# Patient Record
Sex: Female | Born: 1985
Health system: Southern US, Community
[De-identification: ages and names within clinical notes are randomized; demographics above are authoritative.]

## PROBLEM LIST (undated history)

## (undated) ENCOUNTER — Inpatient Hospital Stay (HOSPITAL_COMMUNITY): Payer: Self-pay

## (undated) DIAGNOSIS — F988 Other specified behavioral and emotional disorders with onset usually occurring in childhood and adolescence: Secondary | ICD-10-CM

## (undated) DIAGNOSIS — G43909 Migraine, unspecified, not intractable, without status migrainosus: Secondary | ICD-10-CM

## (undated) DIAGNOSIS — F419 Anxiety disorder, unspecified: Secondary | ICD-10-CM

## (undated) DIAGNOSIS — R51 Headache: Secondary | ICD-10-CM

## (undated) DIAGNOSIS — F32A Depression, unspecified: Secondary | ICD-10-CM

## (undated) DIAGNOSIS — G43019 Migraine without aura, intractable, without status migrainosus: Principal | ICD-10-CM

## (undated) HISTORY — PX: VULVA SURGERY: SHX837

## (undated) HISTORY — DX: Depression, unspecified: F32.A

## (undated) HISTORY — DX: Anxiety disorder, unspecified: F41.9

## (undated) HISTORY — PX: WISDOM TOOTH EXTRACTION: SHX21

## (undated) HISTORY — DX: Migraine without aura, intractable, without status migrainosus: G43.019

## (undated) HISTORY — DX: Other specified behavioral and emotional disorders with onset usually occurring in childhood and adolescence: F98.8

---
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
## 2005-08-13 ENCOUNTER — Emergency Department (HOSPITAL_COMMUNITY): Admission: EM | Admit: 2005-08-13 | Discharge: 2005-08-13 | Payer: Self-pay | Admitting: *Deleted

## 2013-01-11 LAB — OB RESULTS CONSOLE RUBELLA ANTIBODY, IGM: Rubella: IMMUNE

## 2013-01-11 LAB — OB RESULTS CONSOLE GC/CHLAMYDIA
Chlamydia: NEGATIVE
Gonorrhea: NEGATIVE

## 2013-01-11 LAB — OB RESULTS CONSOLE HEPATITIS B SURFACE ANTIGEN: Hepatitis B Surface Ag: NEGATIVE

## 2013-01-13 LAB — OB RESULTS CONSOLE ANTIBODY SCREEN: Antibody Screen: NEGATIVE

## 2013-01-13 LAB — OB RESULTS CONSOLE ABO/RH: RH Type: POSITIVE

## 2013-05-17 LAB — OB RESULTS CONSOLE HIV ANTIBODY (ROUTINE TESTING): HIV: NONREACTIVE

## 2013-05-17 LAB — OB RESULTS CONSOLE RPR: RPR: NONREACTIVE

## 2013-07-07 ENCOUNTER — Encounter (HOSPITAL_COMMUNITY): Payer: Self-pay | Admitting: *Deleted

## 2013-07-07 ENCOUNTER — Inpatient Hospital Stay (HOSPITAL_COMMUNITY)
Admission: AD | Admit: 2013-07-07 | Discharge: 2013-07-07 | Disposition: A | Discharge status: Home / Self Care | Payer: PRIVATE HEALTH INSURANCE | Source: Ambulatory Visit | Attending: Obstetrics and Gynecology | Admitting: Obstetrics and Gynecology

## 2013-07-07 DIAGNOSIS — G43909 Migraine, unspecified, not intractable, without status migrainosus: Secondary | ICD-10-CM | POA: Diagnosis not present

## 2013-07-07 DIAGNOSIS — O368190 Decreased fetal movements, unspecified trimester, not applicable or unspecified: Secondary | ICD-10-CM

## 2013-07-07 DIAGNOSIS — O368131 Decreased fetal movements, third trimester, fetus 1: Secondary | ICD-10-CM

## 2013-07-07 DIAGNOSIS — Q5211 Transverse vaginal septum: Secondary | ICD-10-CM

## 2013-07-07 HISTORY — DX: Headache: R51

## 2013-07-07 MED ORDER — OXYCODONE-ACETAMINOPHEN 5-325 MG PO TABS: 2.0000 | ORAL_TABLET | Freq: Once | ORAL | Status: DC

## 2013-07-07 NOTE — MAU Note (Signed)
Patient states she has not felt fetal movement since Tuesday night until in the waiting room. Denies bleeding, leaking or pain.

## 2013-07-07 NOTE — Discharge Instructions (Signed)
Fetal Monitoring, Fetal Movement Assessment  Fetal movement assessment (FMA) is done by the pregnant woman herself by counting and recording the baby's movements over a certain time period. It is done to see if there are problems with the pregnancy and the baby. Identifying and correcting problems may prevent serious problems from developing with the fetus, including fetal loss. Some pregnancies are complicated by the mother's medical problems. Some of these problems are type 1 diabetes mellitus, high blood pressure and other chronic medical illnesses. This is why it is important to monitor the baby before birth.   OTHER TECHNIQUES OF MONITORING YOUR BABY BEFORE BIRTH  Several tests are in use. These include:   Nonstress test (NST). This test monitors the baby's heart rate when the baby moves.   Contraction stress test (CST). This test monitors the baby's heart rate during a contraction of the uterus.   Fetal biophysical profile (BPP). This measures and evaluates 5 observations of the baby:   The nonstress test.   The baby's breathing.   The baby's movements.   The baby's muscle tone.   The amount of amniotic fluid.   Modified BPP. This measures the volume of fluid in different parts of the amniotic sac (amniotic fluid index) and the results of the nonstress test.   Umbilical artery doppler velocimetry. This evaluates the blood flow through the umbilical cord.  There are several very serious problems that cannot be predicted or detected with any of the fetal monitoring procedures. These problems include separation (abruption) of the placenta or when the fetus chokes on the umbilical cord (umbilical cord accident).  Your caregiver will help you understand your tests and what they mean for you and your baby. It is your responsibility to obtain the results of your test.  LET YOUR CAREGIVER KNOW ABOUT:    Any medications you are taking including prescription and over-the-counter drugs, herbs, eye drops and  creams.   If you have a fever.   If you have an infection.   If you are sick.  RISKS AND COMPLICATIONS   There are no risks or complications to the mother or fetus with FMA.  BEFORE THE PROCEDURE   Do not take medications that may decrease or increase the baby's heart rate and/or movements.   Eat a full meal at least 2 hours before the test.   Do not smoke if you are pregnant. If you smoke, stop at least 2 days before the test. It is best not to smoke at all when you are pregnant.  PROCEDURE  Sometimes, a mother notices her baby moves less before there are problems. Because of this, it is believed that fetal movement checking by the mother (kick counts) is a good way to check the baby before birth.  There are different ways of doing this. Two good ways are:   The woman lies on her side and counts distinct (individual) fetal movements. A feeling of 10 distinct movements in a period of up to 2 hours is considered reassuring. When 10 movements are felt, you may stop counting.   Women are instructed to count fetal movements for 1 hour, three times per week. The count is good if, after one week, it equals or is over the woman's previously established baseline count. If the count is lower, further checking of your baby is needed.  AFTER THE PROCEDURE  You may resume your usual activities.  HOME CARE INSTRUCTIONS    Follow your caregiver's advice and recommendations.     Be aware of your baby's movements. Are they normal, less than usual or more than usual?   Make and keep the rest of your prenatal appointments.  SEEK MEDICAL CARE IF:    You develop a temperature of 100 F (37.8 C) or higher.   You have a bloody mucus discharge from the vagina (a bloody show).  SEEK IMMEDIATE MEDICAL CARE IF:    You do not feel the baby move.   You think the baby's movements are too little or too many.   You develop contractions.   You develop vaginal bleeding.   You have belly (abdominal) pain.   You have leaking or a  gush of fluid from the vagina.  Document Released: 07/18/2002 Document Revised: 10/20/2011 Document Reviewed: 11/20/2008  [employer] Patient Information 2014 [employer].

## 2013-07-07 NOTE — MAU Provider Note (Signed)
  History     CSN: [id number]  Arrival date and time: 07/07/13 1027   First Provider Initiated Contact with Patient 07/07/13 1101      Chief Complaint  Patient presents with  . Decreased Fetal Movement   HPI This is a 27 y.o. female at [redacted]w[redacted]d who presents with report of no fetal movement in 2 days. No leaking or bleeding. No pain  RN Note Patient states she has not felt fetal movement since Tuesday night until in the waiting room. Denies bleeding, leaking or pain.       OB History   Grav Para Term Preterm Abortions TAB SAB Ect Mult Living   1         0      Past Medical History  Diagnosis Date  . Headache(784.0)     Migraines    No past surgical history on file.  No family history on file.  History  Substance Use Topics  . Smoking status: Not on file  . Smokeless tobacco: Not on file  . Alcohol Use: Not on file    Allergies: No Known Allergies  No prescriptions prior to admission    Review of Systems  Constitutional: Negative for fever, chills and malaise/fatigue.  Gastrointestinal: Negative for nausea, vomiting and abdominal pain.  Neurological: Negative for weakness.   Physical Exam   Blood pressure 107/74, pulse 100, temperature 98 F (36.7 C), temperature source Oral, resp. rate 18.  Physical Exam  Constitutional: She is oriented to person, place, and time. She appears well-developed and well-nourished. No distress.  Cardiovascular: Normal rate.   Respiratory: Effort normal.  GI: Soft.  Genitourinary:  Fetal heart rate reactive.  No contractions Audible fetal movement Category I  Musculoskeletal: Normal range of motion.  Neurological: She is alert and oriented to person, place, and time.  Skin: Skin is warm and dry.  Psychiatric: She has a normal mood and affect.    MAU Course  Procedures  MDM Discussed with Dr Ellyn Hack  Assessment and Plan  A:  [facility] at [redacted]w[redacted]d       Decreased perception of fetal movement  P:  Reassured  Discharged home   [facility] 07/07/2013, 11:04 AM

## 2013-07-14 LAB — OB RESULTS CONSOLE GBS: GBS: NEGATIVE

## 2013-08-11 NOTE — L&D Delivery Note (Signed)
Operative Delivery Note Pt pushe well for an hour and a half and brought the vertex to a +3 station..  She became exhausted and requested assistance, and the baby began to have deeper variables with pushing.  She and husband were counseled on the vacuum and risk of cephalohemtoma and desired to proceed.  The bladder was emptied and the Kiwi vacuum applied LOA +3 station in the green zone.  Over about 3 sets of pushes the vertex was brought to crowning, then the vacuum removed and the patient pushed out the remainder of the infant's head, the rest of the body followed without difficulty. At 11:41 PM a viable female was delivered via Vaginal, Vacuum Investment banker, operational(Extractor).  Presentation: vertex; Position: Left,, Occiput,, Anterior; Station: +3.  Verbal consent: obtained from patient.  Risks and benefits discussed in detail.  Risks include, but are not limited to the risks of anesthesia, bleeding, infection, damage to maternal tissues, fetal cephalhematoma.  There is also the risk of inability to effect vaginal delivery of the head, or shoulder dystocia that cannot be resolved by established maneuvers, leading to the need for emergency cesarean section.  APGAR: 7, 8; weigh pending .   Placenta status: Intact, Spontaneous.   Cord: 3 vessels with the following complications none: .   Anesthesia: Epidural Local 1% lidocaine Instruments: Kiwi vacuum Episiotomy: None Lacerations: 2nd degree;Perineal Suture Repair: 2.0 3.0 vicryl  (sphincter reinforced Est. Blood Loss (mL): 350cc  Mom to postpartum.  Baby to Couplet care / Skin to Skin.  Gabrielle Foster,Gabrielle Foster 08/15/2013, 12:26 AM

## 2013-08-14 ENCOUNTER — Inpatient Hospital Stay (HOSPITAL_COMMUNITY)
Admission: AD | Admit: 2013-08-14 | Discharge: 2013-08-16 | DRG: 775 | Disposition: A | Discharge status: Home / Self Care | Payer: PRIVATE HEALTH INSURANCE | Source: Ambulatory Visit | Attending: Obstetrics and Gynecology | Admitting: Obstetrics and Gynecology

## 2013-08-14 ENCOUNTER — Encounter (HOSPITAL_COMMUNITY): Payer: PRIVATE HEALTH INSURANCE | Admitting: Anesthesiology

## 2013-08-14 ENCOUNTER — Encounter (HOSPITAL_COMMUNITY): Payer: Self-pay

## 2013-08-14 ENCOUNTER — Inpatient Hospital Stay (HOSPITAL_COMMUNITY): Payer: PRIVATE HEALTH INSURANCE | Admitting: Anesthesiology

## 2013-08-14 DIAGNOSIS — IMO0001 Reserved for inherently not codable concepts without codable children: Secondary | ICD-10-CM

## 2013-08-14 HISTORY — DX: Migraine, unspecified, not intractable, without status migrainosus: G43.909

## 2013-08-14 LAB — CBC
HCT: 37.2 % (ref 36.0–46.0)
Hemoglobin: 12.2 g/dL (ref 12.0–15.0)
MCH: 28.8 pg (ref 26.0–34.0)
MCHC: 32.8 g/dL (ref 30.0–36.0)
MCV: 87.7 fL (ref 78.0–100.0)
Platelets: 134 10*3/uL — ABNORMAL LOW (ref 150–400)
RBC: 4.24 MIL/uL (ref 3.87–5.11)
RDW: 14.7 % (ref 11.5–15.5)
WBC: 14 10*3/uL — ABNORMAL HIGH (ref 4.0–10.5)

## 2013-08-14 LAB — RPR: RPR Ser Ql: NONREACTIVE

## 2013-08-14 MED ORDER — OXYTOCIN 40 UNITS IN LACTATED RINGERS INFUSION - SIMPLE MED
1.0000 m[IU]/min | INTRAVENOUS | Status: DC
  Administered 2013-08-14: 2 m[IU]/min via INTRAVENOUS
  Filled 2013-08-14: qty 1000

## 2013-08-14 MED ORDER — ACETAMINOPHEN 325 MG PO TABS
650.0000 mg | ORAL_TABLET | ORAL | Status: DC | PRN
  Administered 2013-08-14: 650 mg via ORAL
  Filled 2013-08-14: qty 2

## 2013-08-14 MED ORDER — PHENYLEPHRINE 40 MCG/ML (10ML) SYRINGE FOR IV PUSH (FOR BLOOD PRESSURE SUPPORT)
80.0000 ug | PREFILLED_SYRINGE | INTRAVENOUS | Status: DC | PRN
  Filled 2013-08-14: qty 2

## 2013-08-14 MED ORDER — CITRIC ACID-SODIUM CITRATE 334-500 MG/5ML PO SOLN: 30.0000 mL | ORAL | Status: DC | PRN

## 2013-08-14 MED ORDER — FENTANYL 2.5 MCG/ML BUPIVACAINE 1/10 % EPIDURAL INFUSION (WH - ANES)
INTRAMUSCULAR | Status: AC
  Filled 2013-08-14: qty 125

## 2013-08-14 MED ORDER — FLEET ENEMA 7-19 GM/118ML RE ENEM: 1.0000 | ENEMA | Freq: Once | RECTAL | Status: DC

## 2013-08-14 MED ORDER — LIDOCAINE HCL (PF) 1 % IJ SOLN
INTRAMUSCULAR | Status: DC | PRN
  Administered 2013-08-14 (×2): 8 mL

## 2013-08-14 MED ORDER — DIPHENHYDRAMINE HCL 50 MG/ML IJ SOLN: 12.5000 mg | INTRAMUSCULAR | Status: DC | PRN

## 2013-08-14 MED ORDER — OXYCODONE-ACETAMINOPHEN 5-325 MG PO TABS: 1.0000 | ORAL_TABLET | ORAL | Status: DC | PRN

## 2013-08-14 MED ORDER — SODIUM CHLORIDE 0.9 % IV SOLN
3.0000 g | Freq: Four times a day (QID) | INTRAVENOUS | Status: DC
  Administered 2013-08-14 – 2013-08-15 (×3): 3 g via INTRAVENOUS
  Filled 2013-08-14 (×4): qty 3

## 2013-08-14 MED ORDER — FENTANYL 2.5 MCG/ML BUPIVACAINE 1/10 % EPIDURAL INFUSION (WH - ANES)
INTRAMUSCULAR | Status: DC | PRN
  Administered 2013-08-14: 14 mL/h via EPIDURAL

## 2013-08-14 MED ORDER — OXYTOCIN 40 UNITS IN LACTATED RINGERS INFUSION - SIMPLE MED: 62.5000 mL/h | INTRAVENOUS | Status: DC

## 2013-08-14 MED ORDER — OXYTOCIN BOLUS FROM INFUSION
500.0000 mL | INTRAVENOUS | Status: DC
  Administered 2013-08-14: 500 mL via INTRAVENOUS

## 2013-08-14 MED ORDER — LACTATED RINGERS IV SOLN: 500.0000 mL | Freq: Once | INTRAVENOUS | Status: DC

## 2013-08-14 MED ORDER — PHENYLEPHRINE 40 MCG/ML (10ML) SYRINGE FOR IV PUSH (FOR BLOOD PRESSURE SUPPORT)
PREFILLED_SYRINGE | INTRAVENOUS | Status: AC
  Filled 2013-08-14: qty 5

## 2013-08-14 MED ORDER — LACTATED RINGERS IV SOLN
INTRAVENOUS | Status: DC
  Administered 2013-08-14 (×3): via INTRAVENOUS

## 2013-08-14 MED ORDER — LIDOCAINE HCL (PF) 1 % IJ SOLN
30.0000 mL | INTRAMUSCULAR | Status: DC | PRN
  Administered 2013-08-14: 30 mL via SUBCUTANEOUS
  Filled 2013-08-14 (×2): qty 30

## 2013-08-14 MED ORDER — IBUPROFEN 600 MG PO TABS
600.0000 mg | ORAL_TABLET | Freq: Four times a day (QID) | ORAL | Status: DC | PRN
  Administered 2013-08-15: 600 mg via ORAL
  Filled 2013-08-14: qty 1

## 2013-08-14 MED ORDER — LACTATED RINGERS IV SOLN: 500.0000 mL | INTRAVENOUS | Status: DC | PRN

## 2013-08-14 MED ORDER — EPHEDRINE 5 MG/ML INJ
10.0000 mg | INTRAVENOUS | Status: DC | PRN
  Filled 2013-08-14: qty 2

## 2013-08-14 MED ORDER — FENTANYL 2.5 MCG/ML BUPIVACAINE 1/10 % EPIDURAL INFUSION (WH - ANES)
14.0000 mL/h | INTRAMUSCULAR | Status: DC | PRN
  Administered 2013-08-14: 14 mL/h via EPIDURAL
  Filled 2013-08-14: qty 125

## 2013-08-14 MED ORDER — TERBUTALINE SULFATE 1 MG/ML IJ SOLN: 0.2500 mg | Freq: Once | INTRAMUSCULAR | Status: AC | PRN

## 2013-08-14 MED ORDER — EPHEDRINE 5 MG/ML INJ
INTRAVENOUS | Status: AC
  Filled 2013-08-14: qty 4

## 2013-08-14 MED ORDER — ONDANSETRON HCL 4 MG/2ML IJ SOLN: 4.0000 mg | Freq: Four times a day (QID) | INTRAMUSCULAR | Status: DC | PRN

## 2013-08-14 NOTE — Anesthesia Preprocedure Evaluation (Signed)
Anesthesia Evaluation  Patient identified by MRN, date of birth, ID band Patient awake    Reviewed: Allergy & Precautions, H&P , NPO status , Patient's Chart, lab work & pertinent test results  Airway Mallampati: I TM Distance: >3 FB Neck ROM: full    Dental no notable dental hx.    Pulmonary neg pulmonary ROS,    Pulmonary exam normal       Cardiovascular negative cardio ROS  Rate:Normal     Neuro/Psych negative psych ROS   GI/Hepatic negative GI ROS, Neg liver ROS,   Endo/Other  negative endocrine ROS  Renal/GU negative Renal ROS  negative genitourinary   Musculoskeletal negative musculoskeletal ROS (+)   Abdominal Normal abdominal exam  (+)   Peds  Hematology negative hematology ROS (+)   Anesthesia Other Findings   Reproductive/Obstetrics (+) Pregnancy                           Anesthesia Physical Anesthesia Plan  ASA: II  Anesthesia Plan: Epidural   Post-op Pain Management:    Induction:   Airway Management Planned:   Additional Equipment:   Intra-op Plan:   Post-operative Plan:   Informed Consent: I have reviewed the patients History and Physical, chart, labs and discussed the procedure including the risks, benefits and alternatives for the proposed anesthesia with the patient or authorized representative who has indicated his/her understanding and acceptance.     Plan Discussed with:   Anesthesia Plan Comments:         Anesthesia Quick Evaluation

## 2013-08-14 NOTE — Anesthesia Procedure Notes (Signed)
Epidural Patient location during procedure: OB Start time: 08/14/2013 11:32 AM End time: 08/14/2013 11:36 AM  Staffing Anesthesiologist: Leilani AbleHATCHETT, Jaimey Franchini Performed by: anesthesiologist   Preanesthetic Checklist Completed: patient identified, surgical consent, pre-op evaluation, timeout performed, IV checked, risks and benefits discussed and monitors and equipment checked  Epidural Patient position: sitting Prep: site prepped and draped and DuraPrep Patient monitoring: continuous pulse ox and blood pressure Approach: midline Injection technique: LOR air  Needle:  Needle type: Tuohy  Needle gauge: 17 G Needle length: 9 cm and 9 Needle insertion depth: 6 cm Catheter type: closed end flexible Catheter size: 19 Gauge Catheter at skin depth: 11 cm Test dose: negative and Other  Assessment Sensory level: T9 Events: blood not aspirated, injection not painful, no injection resistance, negative IV test and no paresthesia  Additional Notes Reason for block:procedure for pain

## 2013-08-14 NOTE — MAU Note (Signed)
Contractions every 3-5 min, started getting worse at 4:30, Lost mucus plug, denies water broke.

## 2013-08-14 NOTE — Progress Notes (Signed)
Patient ID: Gabrielle BlackwaterSarah M Foster, female   DOB: [birth year], 28 y.o.   MRN: [id number] Pt has made slow but steady progress and is now completely dilated +2 station Spiked temp to 101+ and started on Unasyn Baby with resultant tachycardia from temp, but otherwise good variability and accels. Will begin pushing.

## 2013-08-14 NOTE — H&P (Signed)
Gabrielle Foster is a 28 y.o. female G1P0 at 5040 2/7 weeks (EDD 08/12/13 by 7 week US) presenting for regular contractions all night long.  Cervix in the office last visit was 50/closed and on admission 80/1-2.  Pt was admitted in early labor for pain management and will be augmented if needed.  Prenatal care uncomplicated.    Maternal Medical History:  Reason for admission: Contractions.   Contractions: Onset was 6-12 hours ago.   Frequency: regular.   Perceived severity is moderate.    Fetal activity: Perceived fetal activity is normal.      OB History   Grav Para Term Preterm Abortions TAB SAB Ect Mult Living   1         0     Past Medical History  Diagnosis Date  . Headache(784.0)     Migraines  . Migraines    Past Surgical History  Procedure Laterality Date  . Wisdom tooth extraction     Family History: family history includes Hyperlipidemia in her mother; Hypertension in her father; Kidney disease in her mother. Social History:  reports that she has never smoked. She has never used smokeless tobacco. She reports that she does not drink alcohol or use illicit drugs.   Prenatal Transfer Tool  Maternal Diabetes: No Genetic Screening: Normal Maternal Ultrasounds/Referrals: Normal Fetal Ultrasounds or other Referrals:  None Maternal Substance Abuse:  No Significant Maternal Medications:  None Significant Maternal Lab Results:  None Other Comments:  None  Review of Systems  Neurological: Negative for headaches.    Dilation: 2 Effacement (%): 90 Station: -2 Exam by:: Gabrielle Oresichardson MD AROM moderate meconium  Blood pressure 111/65, pulse 87, temperature 97.7 F (36.5 C), temperature source Oral, resp. rate 16, height 5\' 8"  (1.727 m), weight 87.091 kg (192 lb), SpO2 98.00%, unknown if currently breastfeeding. Maternal Exam:  Uterine Assessment: Contraction strength is moderate.  Contraction frequency is regular.   Abdomen: Fetal presentation: vertex  Introitus:  Normal vulva. Normal vagina.    Physical Exam  Constitutional: She is oriented to person, place, and time. She appears well-developed and well-nourished.  Cardiovascular: Normal rate and regular rhythm.   Respiratory: Effort normal and breath sounds normal.  GI: Soft.  Genitourinary: Vagina normal.  Neurological: She is alert and oriented to person, place, and time.  Psychiatric: She has a normal mood and affect. Her behavior is normal.    Prenatal labs: ABO, Rh: A/Positive/-- (06/05 0000) Antibody: Negative (06/05 0000) Rubella: Immune (06/03 0000) RPR: Nonreactive (10/07 0000)  HBsAg: Negative (06/03 0000)  HIV: Non-reactive (10/07 0000)  GBS: Negative (12/04 0000)  One hour GCT 127 First trimester screen and AFP WNL  Assessment/Plan: Pt requesting epidural and will receive, Will augment with pitocin as needed.  D/Foster pt meconium stained fluid.   Gabrielle Foster,Gabrielle Foster 08/14/2013, 11:50 AM

## 2013-08-14 NOTE — Progress Notes (Signed)
Dr Senaida Oresichardson notified of pt's VE, contraction pattern, and FHR pattern. Orders received to admit pt.

## 2013-08-14 NOTE — Progress Notes (Signed)
Patient ID: Gabrielle LintSarah S Gonzalez-Graham, female   DOB: [birth year], 28 y.o.   MRN: [id number] Pt comfortable with epidural afeb vss FHR category 1 Cervix 4/90/-1 IUPC placed to adjust pitocin. Follow progress

## 2013-08-15 ENCOUNTER — Encounter (HOSPITAL_COMMUNITY): Payer: Self-pay

## 2013-08-15 LAB — CBC
HCT: 31.7 % — ABNORMAL LOW (ref 36.0–46.0)
Hemoglobin: 10.3 g/dL — ABNORMAL LOW (ref 12.0–15.0)
MCH: 28.8 pg (ref 26.0–34.0)
MCHC: 32.5 g/dL (ref 30.0–36.0)
MCV: 88.5 fL (ref 78.0–100.0)
Platelets: 125 10*3/uL — ABNORMAL LOW (ref 150–400)
RBC: 3.58 MIL/uL — ABNORMAL LOW (ref 3.87–5.11)
RDW: 14.9 % (ref 11.5–15.5)
WBC: 23.6 10*3/uL — ABNORMAL HIGH (ref 4.0–10.5)

## 2013-08-15 MED ORDER — IBUPROFEN 600 MG PO TABS
600.0000 mg | ORAL_TABLET | Freq: Four times a day (QID) | ORAL | Status: DC
  Administered 2013-08-15 – 2013-08-16 (×5): 600 mg via ORAL
  Filled 2013-08-15 (×5): qty 1

## 2013-08-15 MED ORDER — ZOLPIDEM TARTRATE 5 MG PO TABS: 5.0000 mg | ORAL_TABLET | Freq: Every evening | ORAL | Status: DC | PRN

## 2013-08-15 MED ORDER — LANOLIN HYDROUS EX OINT: TOPICAL_OINTMENT | CUTANEOUS | Status: DC | PRN

## 2013-08-15 MED ORDER — WITCH HAZEL-GLYCERIN EX PADS: 1.0000 "application " | MEDICATED_PAD | CUTANEOUS | Status: DC | PRN

## 2013-08-15 MED ORDER — ONDANSETRON HCL 4 MG/2ML IJ SOLN: 4.0000 mg | INTRAMUSCULAR | Status: DC | PRN

## 2013-08-15 MED ORDER — DIBUCAINE 1 % RE OINT: 1.0000 "application " | TOPICAL_OINTMENT | RECTAL | Status: DC | PRN

## 2013-08-15 MED ORDER — OXYCODONE-ACETAMINOPHEN 5-325 MG PO TABS
1.0000 | ORAL_TABLET | ORAL | Status: DC | PRN
  Administered 2013-08-15: 1 via ORAL
  Filled 2013-08-15: qty 1

## 2013-08-15 MED ORDER — ONDANSETRON HCL 4 MG PO TABS: 4.0000 mg | ORAL_TABLET | ORAL | Status: DC | PRN

## 2013-08-15 MED ORDER — SIMETHICONE 80 MG PO CHEW: 80.0000 mg | CHEWABLE_TABLET | ORAL | Status: DC | PRN

## 2013-08-15 MED ORDER — BENZOCAINE-MENTHOL 20-0.5 % EX AERO
1.0000 "application " | INHALATION_SPRAY | CUTANEOUS | Status: DC | PRN
  Administered 2013-08-15: 1 via TOPICAL
  Filled 2013-08-15: qty 56

## 2013-08-15 MED ORDER — SENNOSIDES-DOCUSATE SODIUM 8.6-50 MG PO TABS
2.0000 | ORAL_TABLET | ORAL | Status: DC
  Administered 2013-08-15: 2 via ORAL
  Filled 2013-08-15: qty 2

## 2013-08-15 MED ORDER — DIPHENHYDRAMINE HCL 25 MG PO CAPS: 25.0000 mg | ORAL_CAPSULE | Freq: Four times a day (QID) | ORAL | Status: DC | PRN

## 2013-08-15 MED ORDER — PRENATAL MULTIVITAMIN CH
1.0000 | ORAL_TABLET | Freq: Every day | ORAL | Status: DC
  Administered 2013-08-15: 1 via ORAL
  Filled 2013-08-15: qty 1

## 2013-08-15 MED ORDER — TETANUS-DIPHTH-ACELL PERTUSSIS 5-2.5-18.5 LF-MCG/0.5 IM SUSP: 0.5000 mL | Freq: Once | INTRAMUSCULAR | Status: DC

## 2013-08-15 NOTE — Progress Notes (Addendum)
Post Partum Day 1 Subjective: no complaints and up ad lib  Objective: Blood pressure 99/64, pulse 82, temperature 97.6 F (36.4 C), temperature source Oral, resp. rate 20, height 5\' 8"  (1.727 m), weight 87.091 kg (192 lb), SpO2 98.00%, unknown if currently breastfeeding.  Physical Exam:  General: alert and cooperative Lochia: appropriate Uterine Fundus: firm    Recent Labs  08/14/13 0745 08/15/13 0645  HGB 12.2 10.3*  HCT 37.2 31.7*    Assessment/Plan: Afebrile, will stop unasyn Plan for discharge tomorrow   LOS: 1 day   Gabrielle Foster 08/15/2013, 8:59 AM

## 2013-08-15 NOTE — Anesthesia Postprocedure Evaluation (Signed)
  Anesthesia Post-op Note  Patient: Gabrielle BlackwaterSarah M Foster  Procedure(s) Performed: * No procedures listed *  Patient Location: Mother/Baby  Anesthesia Type:MAC and Epidural  Level of Consciousness: awake, oriented and patient cooperative  Airway and Oxygen Therapy: Patient Spontanous Breathing  Post-op Pain: none  Post-op Assessment: Patient's Cardiovascular Status Stable, Respiratory Function Stable, Patent Airway, No signs of Nausea or vomiting, Adequate PO intake, Pain level controlled, No headache, No backache, No residual numbness and No residual motor weakness  Post-op Vital Signs: Reviewed and stable  Complications: No apparent anesthesia complications

## 2013-08-15 NOTE — Lactation Note (Signed)
This note was copied from the chart of Gabrielle Foster. Lactation Consultation Note  Patient Name: Gabrielle Foster ZOXWR'UToday's Date: 08/15/2013 Reason for consult: Initial assessment Mom reports baby is nursing well, some mild tenderness. Advised to apply EBM to sore nipples.  BF basics reviewed. Continue to que base BF, cluster feeding discussed. Lactation brochure left for review, advised of OP services and support group. Encouraged to call if she would like LC assist.   Maternal Data Formula Feeding for Exclusion: No  Feeding Feeding Type: Breast Fed Length of feed: 15 min  LATCH Score/Interventions                      Lactation Tools Discussed/Used     Consult Status Consult Status: Follow-up Date: 08/16/13 Follow-up type: In-patient    Alfred LevinsGranger, Jarrell Armond Ann 08/15/2013, 5:58 PM

## 2013-08-15 NOTE — Progress Notes (Signed)
Delivery of live viable female by Dr. Senaida Oresichardson at 2341.

## 2013-08-16 MED ORDER — IBUPROFEN 600 MG PO TABS: 600.0000 mg | ORAL_TABLET | Freq: Four times a day (QID) | ORAL | Status: DC

## 2013-08-16 MED ORDER — BENZOCAINE-MENTHOL 20-0.5 % EX AERO: 1.0000 "application " | INHALATION_SPRAY | Freq: Three times a day (TID) | CUTANEOUS | Status: DC | PRN

## 2013-08-16 MED ORDER — OXYCODONE-ACETAMINOPHEN 5-325 MG PO TABS: 1.0000 | ORAL_TABLET | ORAL | Status: DC | PRN

## 2013-08-16 NOTE — Discharge Summary (Signed)
Obstetric Discharge Summary Reason for Admission: onset of labor Prenatal Procedures: none Intrapartum Procedures: vacuum Postpartum Procedures: antibiotics Complications-Operative and Postpartum: second degree perineal laceration Hemoglobin  Date Value Range Status  08/15/2013 10.3* 12.0 - 15.0 g/dL Final     HCT  Date Value Range Status  08/15/2013 31.7* 36.0 - 46.0 % Final    Physical Exam:  General: alert and cooperative Lochia: appropriate Uterine Fundus: firm   Discharge Diagnoses: Term Pregnancy-delivered  Discharge Information: Date: 08/16/2013 Activity: pelvic rest Diet: routine Medications: Ibuprofen and Percocet Condition: improved Instructions: refer to practice specific booklet Discharge to: home Follow-up Information   Follow up with Oliver PilaICHARDSON,Sarika Baldini W, MD. Schedule an appointment as soon as possible for a visit in 6 weeks. (postpartum)    Specialty:  Obstetrics and Gynecology   Contact information:   [address], [address] [place] [address] [phone]       Newborn Data: Live born female  Birth Weight: 8 lb 15.4 oz (4065 g) APGAR: 7, 8  Home with mother.  Oliver PilaICHARDSON,Kery Batzel W 08/16/2013, 9:11 AM

## 2013-08-16 NOTE — Progress Notes (Signed)
Post Partum Day 2 Subjective: no complaints and tolerating PO  Objective: Blood pressure 106/73, pulse 65, temperature 97.3 F (36.3 C), temperature source Oral, resp. rate 18, height 5\' 8"  (1.727 m), weight 87.091 kg (192 lb), SpO2 97.00%, unknown if currently breastfeeding.  Physical Exam:  General: alert and cooperative Lochia: appropriate Uterine Fundus: firm    Recent Labs  08/14/13 0745 08/15/13 0645  HGB 12.2 10.3*  HCT 37.2 31.7*    Assessment/Plan: Discharge home   LOS: 2 days   Hale Chalfin W 08/16/2013, 9:09 AM

## 2013-08-17 ENCOUNTER — Inpatient Hospital Stay (HOSPITAL_COMMUNITY): Admission: RE | Admit: 2013-08-17 | Payer: PRIVATE HEALTH INSURANCE | Source: Ambulatory Visit

## 2014-06-12 ENCOUNTER — Encounter (HOSPITAL_COMMUNITY): Payer: Self-pay

## 2014-07-05 ENCOUNTER — Ambulatory Visit (INDEPENDENT_AMBULATORY_CARE_PROVIDER_SITE_OTHER): Payer: No Typology Code available for payment source | Admitting: Family Medicine

## 2014-07-05 VITALS — BP 112/70 | HR 94 | Temp 97.6°F | Resp 18 | Ht 67.0 in | Wt 159.0 lb

## 2014-07-05 DIAGNOSIS — M26629 Arthralgia of temporomandibular joint, unspecified side: Secondary | ICD-10-CM

## 2014-07-05 DIAGNOSIS — M2662 Arthralgia of temporomandibular joint: Secondary | ICD-10-CM

## 2014-07-05 DIAGNOSIS — G44229 Chronic tension-type headache, not intractable: Secondary | ICD-10-CM

## 2014-07-05 DIAGNOSIS — J019 Acute sinusitis, unspecified: Secondary | ICD-10-CM

## 2014-07-05 MED ORDER — AMOXICILLIN-POT CLAVULANATE 875-125 MG PO TABS: 1.0000 | ORAL_TABLET | Freq: Two times a day (BID) | ORAL | Status: DC

## 2014-07-05 NOTE — Progress Notes (Signed)
Subjective:  This chart was scribed for Gabrielle FloodJeffrey R Michiah Masse, MD by Gabrielle BillsEssence Foster, ED Scribe. The patient was seen in room 11. Patient's care was started at 4:56 PM.   Patient ID: Gabrielle BlackwaterSarah M Foster, female    DOB: [birth year], 28 y.o.   MRN: [id number]  Chief Complaint  Patient presents with  . Sinusitis    ob gyn called in azithromycin monday but feels worse x1 week   . Cough  . Facial Pain  . Generalized Body Aches  . Chills   HPI HPI Comments: Gabrielle BlackwaterSarah M Foster is a 28 y.o. female who presents to the [facility] complaining of gradually worsening sinus pressure over the past 3 days. Pt reports having a cold 2 weeks ago with symptoms of cough and congestion for 3-4 days that self-resolved. Pt reports that sinus pressure returned 3 days ago with associated cough, L sided facial pain, neck pain, L jaw pain, mild sore throat, generalized body aches, chills, HA, minimal photophobia. She denies fever, congestion, rhinorrhea, phonophobia, nausea, vomiting, dizziness at this time. Pt reports h/o migraines but states that HA does not feel similar. Pt was [facility] for sinusitis and was started on Azithromycin. Pt takes her last dose of Azithromycin tomorrow. Pt denies h/o allergies.   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   PCP: No PCP Per Patient  Patient Active Problem List   Diagnosis Date Noted  . Vacuum extractor delivery, delivered 08/15/2013  . Active labor 08/14/2013  . Transverse vaginal septum 07/07/2013  . Migraines 07/07/2013   Past Medical History  Diagnosis Date  . Headache(784.0)     Migraines  . Migraines    Past Surgical History  Procedure Laterality Date  . Wisdom tooth extraction     No Known Allergies Prior to Admission medications   Medication Sig Start Date End Date Taking? Authorizing Provider  azithromycin (ZITHROMAX) 250 MG tablet Take 250 mg by mouth daily.   Yes  Historical Provider, MD  levonorgestrel-ethinyl estradiol (ORSYTHIA) 0.1-20 MG-MCG tablet Take 1 tablet by mouth daily.   Yes Historical Provider, MD  benzocaine-Menthol (DERMOPLAST) 20-0.5 % AERO Apply 1 application topically 3 (three) times daily as needed for irritation (perineal discomfort). Patient not taking: Reported on 07/05/2014 08/16/13   Gabrielle PilaKathy W Richardson, MD  ibuprofen (ADVIL,MOTRIN) 600 MG tablet Take 1 tablet (600 mg total) by mouth every 6 (six) hours. Patient not taking: Reported on 07/05/2014 08/16/13   Gabrielle PilaKathy W Richardson, MD  oxyCODONE-acetaminophen (PERCOCET/ROXICET) 5-325 MG per tablet Take 1-2 tablets by mouth every 4 (four) hours as needed for severe pain (moderate - severe pain). Patient not taking: Reported on 07/05/2014 08/16/13   Gabrielle PilaKathy W Richardson, MD  Prenatal Vit-Fe Fumarate-FA (PRENATAL MULTIVITAMIN) TABS tablet Take 1 tablet by mouth daily at 12 noon.    Historical Provider, MD   History   Social History  . Marital Status: Married    Spouse Name: N/A    Number of Children: N/A  . Years of Education: N/A   Occupational History  . Not on file.   Social History Main Topics  . Smoking status: Never Smoker   . Smokeless tobacco: Never Used  . Alcohol Use: No  . Drug Use: No  . Sexual Activity: Yes   Other Topics Concern  . Not on file   Social History Narrative   Review of Systems  Constitutional: Positive for chills. Negative for fever.  HENT: Positive for sinus pressure and sore throat. Negative for congestion and rhinorrhea.   Eyes: Positive for photophobia (minimal).  Respiratory: Positive for cough.   Gastrointestinal: Negative for nausea and vomiting.  Musculoskeletal: Positive for myalgias and neck pain.  Allergic/Immunologic: Negative for environmental allergies.  Neurological: Positive for headaches.      Objective:   Physical Exam  Constitutional: She is oriented to person, place, and time. She appears well-developed and well-nourished. No  distress.  HENT:  Head: Normocephalic and atraumatic.  Right Ear: Hearing, tympanic membrane, external ear and ear canal normal.  Left Ear: Hearing, tympanic membrane, external ear and ear canal normal.  Nose: Left sinus exhibits maxillary sinus tenderness and frontal sinus tenderness.  Mouth/Throat: Oropharynx is clear and moist. No oropharyngeal exudate.  Tender over L TMJ but jaw otherwise non-tender  No significant decay noted No periodontal erythema or swelling Teeth were non-tender when individually percussed   Eyes: Conjunctivae and EOM are normal. Pupils are equal, round, and reactive to light.  Neck: Normal range of motion. Neck supple. No Brudzinski's sign noted.  Tender over SCM on the left No LED  Cardiovascular: Normal rate, regular rhythm, normal heart sounds and intact distal pulses.   No murmur heard. Pulmonary/Chest: Effort normal and breath sounds normal. No respiratory distress. She has no wheezes. She has no rhonchi.  Abdominal: There is no tenderness.  Musculoskeletal:  Tender over L trapezius  Minimal tenderness over L paraspinal muscles on the L  Neurological: She is alert and oriented to person, place, and time. She displays a negative Romberg sign.  No pronator drift Normal heel to toe  Skin: Skin is warm and dry. No rash noted.  Psychiatric: She has a normal mood and affect. Her behavior is normal.  Vitals reviewed.  Filed Vitals:   07/05/14 1523  BP: 112/70  Pulse: 94  Temp: 97.6 F (36.4 C)  TempSrc: Oral  Resp: 18  Height: 5\' 7"  (1.702 m)  Weight: 159 lb (72.122 kg)  SpO2: 100%      Assessment & Plan:   [name] is a 28 y.o. female Acute sinusitis, recurrence not specified, unspecified location - Plan: amoxicillin-clavulanate (AUGMENTIN) 875-125 MG per tablet  Chronic tension-type headache, not intractable  TMJ arthralgia  Suspect multifactorial with initial sinusitis, not responding to azithromycin, with secondary tension  or Migraine HA and TMJ pain. Does not appear to have meningitis at this point, but RTC/ER precautions discussed   -stop Zpak, start Augmentin.   -can try typical effective migraine treatment (excedrin migraine and rest), heat or cold to myalgias and neck pain.   -RTC for recheck if not improving in next 2-3 days. Sooner or to ER if worse.   Meds ordered this encounter  . amoxicillin-clavulanate (AUGMENTIN) 875-125 MG per tablet    Sig: Take 1 tablet by mouth 2 (two) times daily.    Dispense:  20 tablet    Refill:  0   Patient Instructions  Sinus infection possible with secondary tension or migraine headache. Usual treatment for migraine ok, hot compresses or ice to sore muscles on neck, stop azithromycin, start Augmentin.   If not improving in next 2-3 days, or worsening sooner (increased headache, fever, or  Other worsening symptoms) - return here or emergency room.  Sinusitis Sinusitis is redness, soreness, and inflammation of the paranasal sinuses. Paranasal sinuses are air pockets within the bones of your face (beneath the eyes, the middle of the forehead, or above the eyes). In healthy  paranasal sinuses, mucus is able to drain out, and air is able to circulate through them by way of your nose. However, when your paranasal sinuses are inflamed, mucus and air can become trapped. This can allow bacteria and other germs to grow and cause infection. Sinusitis can develop quickly and last only a short time (acute) or continue over a long period (chronic). Sinusitis that lasts for more than 12 weeks is considered chronic.  CAUSES  Causes of sinusitis include:  Allergies.  Structural abnormalities, such as displacement of the cartilage that separates your nostrils (deviated septum), which can decrease the air flow through your nose and sinuses and affect sinus drainage.  Functional abnormalities, such as when the small hairs (cilia) that line your sinuses and help remove mucus do not work  properly or are not present. SIGNS AND SYMPTOMS  Symptoms of acute and chronic sinusitis are the same. The primary symptoms are pain and pressure around the affected sinuses. Other symptoms include:  Upper toothache.  Earache.  Headache.  Bad breath.  Decreased sense of smell and taste.  A cough, which worsens when you are lying flat.  Fatigue.  Fever.  Thick drainage from your nose, which often is green and may contain pus (purulent).  Swelling and warmth over the affected sinuses. DIAGNOSIS  Your health care provider will perform a physical exam. During the exam, your health care provider may:  Look in your nose for signs of abnormal growths in your nostrils (nasal polyps).  Tap over the affected sinus to check for signs of infection.  View the inside of your sinuses (endoscopy) using an imaging device that has a light attached (endoscope). If your health care provider suspects that you have chronic sinusitis, one or more of the following tests may be recommended:  Allergy tests.  Nasal culture. A sample of mucus is taken from your nose, sent to a lab, and screened for bacteria.  Nasal cytology. A sample of mucus is taken from your nose and examined by your health care provider to determine if your sinusitis is related to an allergy. TREATMENT  Most cases of acute sinusitis are related to a viral infection and will resolve on their own within 10 days. Sometimes medicines are prescribed to help relieve symptoms (pain medicine, decongestants, nasal steroid sprays, or saline sprays).  However, for sinusitis related to a bacterial infection, your health care provider will prescribe antibiotic medicines. These are medicines that will help kill the bacteria causing the infection.  Rarely, sinusitis is caused by a fungal infection. In theses cases, your health care provider will prescribe antifungal medicine. For some cases of chronic sinusitis, surgery is needed. Generally,  these are cases in which sinusitis recurs more than 3 times per year, despite other treatments. HOME CARE INSTRUCTIONS   Drink plenty of water. Water helps thin the mucus so your sinuses can drain more easily.  Use a humidifier.  Inhale steam 3 to 4 times a day (for example, sit in the bathroom with the shower running).    Apply a warm, moist washcloth to your face 3 to 4 times a day, or as directed by your health care provider.  Use saline nasal sprays to help moisten and clean your sinuses.  Take medicines only as directed by your health care provider.  If you were prescribed either an antibiotic or antifungal medicine, finish it all even if you start to feel better. SEEK IMMEDIATE MEDICAL CARE IF:  You have increasing pain or severe headaches.  You have nausea, vomiting, or drowsiness.  You have swelling around your face.  You have vision problems.  You have a stiff neck.  You have difficulty breathing. MAKE SURE YOU:   Understand these instructions.  Will watch your condition.  Will get help right away if you are not doing well or get worse. Document Released: 07/28/2005 Document Revised: 12/12/2013 Document Reviewed: 08/12/2011 [facility] Patient Information 2015 [place], [place]. This information is not intended to replace advice given to you by your health care provider. Make sure you discuss any questions you have with your health care provider.  Temporomandibular Problems  Temporomandibular joint (TMJ) dysfunction means there are problems with the joint between your jaw and your skull. This is a joint lined by cartilage like other joints in your body but also has a small disc in the joint which keeps the bones from rubbing on each other. These joints are like other joints and can get inflamed (sore) from arthritis and other problems. When this joint gets sore, it can cause headaches and pain in the jaw and the face. CAUSES  Usually the arthritic types of problems are  caused by soreness in the joint. Soreness in the joint can also be caused by overuse. This may come from grinding your teeth. It may also come from mis-alignment in the joint. DIAGNOSIS Diagnosis of this condition can often be made by history and exam. Sometimes your caregiver may need X-rays or an MRI scan to determine the exact cause. It may be necessary to see your dentist to determine if your teeth and jaws are lined up correctly. TREATMENT  Most of the time this problem is not serious; however, sometimes it can persist (become chronic). When this happens medications that will cut down on inflammation (soreness) help. Sometimes a shot of cortisone into the joint will be helpful. If your teeth are not aligned it may help for your dentist to make a splint for your mouth that can help this problem. If no physical problems can be found, the problem may come from tension. If tension is found to be the cause, biofeedback or relaxation techniques may be helpful. HOME CARE INSTRUCTIONS   Later in the day, applications of ice packs may be helpful. Ice can be used in a plastic bag with a towel around it to prevent frostbite to skin. This may be used about every 2 hours for 20 to 30 minutes, as needed while awake, or as directed by your caregiver.  Only take over-the-counter or prescription medicines for pain, discomfort, or fever as directed by your caregiver.  If physical therapy was prescribed, follow your caregiver's directions.  Wear mouth appliances as directed if they were given. Document Released: 04/22/2001 Document Revised: 10/20/2011 Document Reviewed: 07/30/2008 [facility] Patient Information 2015 [place], [place]. This information is not intended to replace advice given to you by your health care provider. Make sure you discuss any questions you have with your health care provider.       I personally performed the services described in this documentation, which was scribed in my presence.  The recorded information has been reviewed and considered, and addended by me as needed.

## 2014-07-05 NOTE — Patient Instructions (Signed)
Sinus infection possible with secondary tension or migraine headache. Usual treatment for migraine ok, hot compresses or ice to sore muscles on neck, stop azithromycin, start Augmentin.   If not improving in next 2-3 days, or worsening sooner (increased headache, fever, or  Other worsening symptoms) - return here or emergency room.  Sinusitis Sinusitis is redness, soreness, and inflammation of the paranasal sinuses. Paranasal sinuses are air pockets within the bones of your face (beneath the eyes, the middle of the forehead, or above the eyes). In healthy paranasal sinuses, mucus is able to drain out, and air is able to circulate through them by way of your nose. However, when your paranasal sinuses are inflamed, mucus and air can become trapped. This can allow bacteria and other germs to grow and cause infection. Sinusitis can develop quickly and last only a short time (acute) or continue over a long period (chronic). Sinusitis that lasts for more than 12 weeks is considered chronic.  CAUSES  Causes of sinusitis include:  Allergies.  Structural abnormalities, such as displacement of the cartilage that separates your nostrils (deviated septum), which can decrease the air flow through your nose and sinuses and affect sinus drainage.  Functional abnormalities, such as when the small hairs (cilia) that line your sinuses and help remove mucus do not work properly or are not present. SIGNS AND SYMPTOMS  Symptoms of acute and chronic sinusitis are the same. The primary symptoms are pain and pressure around the affected sinuses. Other symptoms include:  Upper toothache.  Earache.  Headache.  Bad breath.  Decreased sense of smell and taste.  A cough, which worsens when you are lying flat.  Fatigue.  Fever.  Thick drainage from your nose, which often is green and may contain pus (purulent).  Swelling and warmth over the affected sinuses. DIAGNOSIS  Your health care provider will perform a  physical exam. During the exam, your health care provider may:  Look in your nose for signs of abnormal growths in your nostrils (nasal polyps).  Tap over the affected sinus to check for signs of infection.  View the inside of your sinuses (endoscopy) using an imaging device that has a light attached (endoscope). If your health care provider suspects that you have chronic sinusitis, one or more of the following tests may be recommended:  Allergy tests.  Nasal culture. A sample of mucus is taken from your nose, sent to a lab, and screened for bacteria.  Nasal cytology. A sample of mucus is taken from your nose and examined by your health care provider to determine if your sinusitis is related to an allergy. TREATMENT  Most cases of acute sinusitis are related to a viral infection and will resolve on their own within 10 days. Sometimes medicines are prescribed to help relieve symptoms (pain medicine, decongestants, nasal steroid sprays, or saline sprays).  However, for sinusitis related to a bacterial infection, your health care provider will prescribe antibiotic medicines. These are medicines that will help kill the bacteria causing the infection.  Rarely, sinusitis is caused by a fungal infection. In theses cases, your health care provider will prescribe antifungal medicine. For some cases of chronic sinusitis, surgery is needed. Generally, these are cases in which sinusitis recurs more than 3 times per year, despite other treatments. HOME CARE INSTRUCTIONS   Drink plenty of water. Water helps thin the mucus so your sinuses can drain more easily.  Use a humidifier.  Inhale steam 3 to 4 times a day (for example, sit  in the bathroom with the shower running).    Apply a warm, moist washcloth to your face 3 to 4 times a day, or as directed by your health care provider.  Use saline nasal sprays to help moisten and clean your sinuses.  Take medicines only as directed by your health care  provider.  If you were prescribed either an antibiotic or antifungal medicine, finish it all even if you start to feel better. SEEK IMMEDIATE MEDICAL CARE IF:  You have increasing pain or severe headaches.  You have nausea, vomiting, or drowsiness.  You have swelling around your face.  You have vision problems.  You have a stiff neck.  You have difficulty breathing. MAKE SURE YOU:   Understand these instructions.  Will watch your condition.  Will get help right away if you are not doing well or get worse. Document Released: 07/28/2005 Document Revised: 12/12/2013 Document Reviewed: 08/12/2011 [facility] Patient Information 2015 [place], [place]. This information is not intended to replace advice given to you by your health care provider. Make sure you discuss any questions you have with your health care provider.  Temporomandibular Problems  Temporomandibular joint (TMJ) dysfunction means there are problems with the joint between your jaw and your skull. This is a joint lined by cartilage like other joints in your body but also has a small disc in the joint which keeps the bones from rubbing on each other. These joints are like other joints and can get inflamed (sore) from arthritis and other problems. When this joint gets sore, it can cause headaches and pain in the jaw and the face. CAUSES  Usually the arthritic types of problems are caused by soreness in the joint. Soreness in the joint can also be caused by overuse. This may come from grinding your teeth. It may also come from mis-alignment in the joint. DIAGNOSIS Diagnosis of this condition can often be made by history and exam. Sometimes your caregiver may need X-rays or an MRI scan to determine the exact cause. It may be necessary to see your dentist to determine if your teeth and jaws are lined up correctly. TREATMENT  Most of the time this problem is not serious; however, sometimes it can persist (become chronic). When this  happens medications that will cut down on inflammation (soreness) help. Sometimes a shot of cortisone into the joint will be helpful. If your teeth are not aligned it may help for your dentist to make a splint for your mouth that can help this problem. If no physical problems can be found, the problem may come from tension. If tension is found to be the cause, biofeedback or relaxation techniques may be helpful. HOME CARE INSTRUCTIONS   Later in the day, applications of ice packs may be helpful. Ice can be used in a plastic bag with a towel around it to prevent frostbite to skin. This may be used about every 2 hours for 20 to 30 minutes, as needed while awake, or as directed by your caregiver.  Only take over-the-counter or prescription medicines for pain, discomfort, or fever as directed by your caregiver.  If physical therapy was prescribed, follow your caregiver's directions.  Wear mouth appliances as directed if they were given. Document Released: 04/22/2001 Document Revised: 10/20/2011 Document Reviewed: 07/30/2008 [facility]Exit[facility] Patient Information 2015 [place]ExitCare, [place]. This information is not intended to replace advice given to you by your health care provider. Make sure you discuss any questions you have with your health care provider.

## 2014-10-18 ENCOUNTER — Ambulatory Visit (INDEPENDENT_AMBULATORY_CARE_PROVIDER_SITE_OTHER): Payer: No Typology Code available for payment source | Admitting: Family Medicine

## 2014-10-18 VITALS — BP 118/68 | HR 95 | Temp 97.4°F | Resp 17 | Ht 68.0 in | Wt 156.0 lb

## 2014-10-18 DIAGNOSIS — J069 Acute upper respiratory infection, unspecified: Secondary | ICD-10-CM | POA: Diagnosis not present

## 2014-10-18 DIAGNOSIS — J0120 Acute ethmoidal sinusitis, unspecified: Secondary | ICD-10-CM | POA: Diagnosis not present

## 2014-10-18 DIAGNOSIS — J040 Acute laryngitis: Secondary | ICD-10-CM

## 2014-10-18 DIAGNOSIS — R059 Cough, unspecified: Secondary | ICD-10-CM

## 2014-10-18 DIAGNOSIS — R05 Cough: Secondary | ICD-10-CM | POA: Diagnosis not present

## 2014-10-18 MED ORDER — PREDNISONE 20 MG PO TABS: ORAL_TABLET | ORAL | Status: DC

## 2014-10-18 MED ORDER — BENZONATATE 100 MG PO CAPS: 100.0000 mg | ORAL_CAPSULE | Freq: Three times a day (TID) | ORAL | Status: DC | PRN

## 2014-10-18 MED ORDER — AZITHROMYCIN 250 MG PO TABS: ORAL_TABLET | ORAL | Status: DC

## 2014-10-18 NOTE — Progress Notes (Signed)
Subjective: 29 year old lady who has been ill for almost a week since last Thursday. She does not smoke. She had what she thought was just a common cold last week. She is feeling better without the town Friday. The weekend she gradually got worse and this week. She has a cough, laryngitis, and is felt shortness of breath. She has not been wheezing. She feels like the mucus stuck in her airway. She did work yesterday. Her spouse and child not been ill.  Objective: TMs are normal. Throat clear. Sinuses nontender. Neck supple without significant nodes. Chest is clear to auscultation. She is hoarse.  Assessment: Upper respiratory infection with laryngitis and cough and shortness of breath  Plan: This been going on fairly long and is doing worse rather than better. Will go ahead and give her a round of azithromycin. Also was on a few days of prednisone to try and relieve the laryngeal inflammation and post viral cough

## 2014-10-18 NOTE — Patient Instructions (Signed)
Drink plenty of fluids and try to get enough rest  Take the prednisone 3 pills together each morning for 3 days. This is for inflammation of your larynx and for the postviral cough  Take the Tessalon cough pills (benzonatate) or 2 pills 3 times daily as needed for cough  Azithromycin 2 pills initially, then 1 daily for 4 days for antibiotic  Return if worse such as increasing shortness of breath or fevers or any other worsening symptoms..Marland Kitchen

## 2015-04-09 ENCOUNTER — Ambulatory Visit (INDEPENDENT_AMBULATORY_CARE_PROVIDER_SITE_OTHER): Payer: No Typology Code available for payment source | Admitting: Neurology

## 2015-04-09 ENCOUNTER — Encounter: Payer: Self-pay | Admitting: Neurology

## 2015-04-09 VITALS — BP 114/79 | HR 73 | Ht 68.0 in | Wt 158.5 lb

## 2015-04-09 DIAGNOSIS — G43019 Migraine without aura, intractable, without status migrainosus: Secondary | ICD-10-CM | POA: Diagnosis not present

## 2015-04-09 HISTORY — DX: Migraine without aura, intractable, without status migrainosus: G43.019

## 2015-04-09 MED ORDER — TOPIRAMATE 25 MG PO TABS: ORAL_TABLET | ORAL | Status: DC

## 2015-04-09 MED ORDER — FOLIC ACID 1 MG PO TABS: 1.0000 mg | ORAL_TABLET | Freq: Every day | ORAL | Status: DC

## 2015-04-09 NOTE — Progress Notes (Signed)
Reason for visit: Migraine headache  Referring physician: Dr. Thomasene Lot [name] is a 29 y.o. female  History of present illness:  Gabrielle Foster is a 29 year old right-handed white female with a history of migraine headaches. The patient indicates that she has had migraine since elementary school. The headaches in the past have come in cycles, she may have many headaches in a month or 2, and then go several months without any headache. The patient indicates that over the last 3 or 4 months, the headaches have been much more frequent, and more severe. The headaches may last up to 2 days. She indicates some increased stress associated with a recent move. Stress and dehydration may make the headaches worse. The headaches are currently occurring 10 days out of a month. The patient is missing some work because of the headache. The headaches usually begin on the right side, but can occur on the left. They start in the shoulder area, coming up the back of the neck, and into the side of the head. The patient may have photophobia and phonophobia, as well as nausea and vomiting. She may have some blurring of vision, but no loss of vision. She indicates that taking a nap may sometimes help. She has used Excedrin Migraine with some success previously, but this has become less effective recently. The patient was placed on Relpax which does help, but she is using the medication frequently. The patient denies any focal numbness or weakness of the face, arms, or legs. She indicates that the dizziness may be associated with the headache, she rarely has significant issues with cognitive dysfunction. Her father and a sister also had migraine headache. She is sent to this office for an evaluation.  Past Medical History  Diagnosis Date  . Headache(784.0)     Migraines  . Migraines   . Common migraine with intractable migraine 04/09/2015    Past Surgical History  Procedure Laterality Date  .  Wisdom tooth extraction      Family History  Problem Relation Age of Onset  . Kidney disease Mother   . Hyperlipidemia Mother   . Heart disease Mother   . Hypertension Father   . Migraines Father   . Migraines Sister     Social history:  reports that she has never smoked. She has never used smokeless tobacco. She reports that she drinks alcohol. She reports that she does not use illicit drugs.  Medications:  Prior to Admission medications   Medication Sig Start Date End Date Taking? Authorizing Provider  eletriptan (RELPAX) 20 MG tablet Take 20 mg by mouth as needed for migraine or headache. May repeat in 2 hours if headache persists or recurs.   Yes Historical Provider, MD  levonorgestrel-ethinyl estradiol (ORSYTHIA) 0.1-20 MG-MCG tablet Take 1 tablet by mouth daily.   Yes Historical Provider, MD     No Known Allergies  ROS:  Out of a complete 14 system review of symptoms, the patient complains only of the following symptoms, and all other reviewed systems are negative.  Weight gain Easy bruising Migraine headache  Blood pressure 114/79, pulse 73, height  (1.727 m), weight 158 lb 8 oz (71.895 kg), not currently breastfeeding.  Physical Exam  General: The patient is alert and cooperative at the time of the examination.  Eyes: Pupils are equal, round, and reactive to light. Discs are flat bilaterally.  Neck: The neck is supple, no carotid bruits are noted.  Respiratory: The respiratory examination is clear.  Cardiovascular: The cardiovascular examination reveals a regular rate and rhythm, no obvious murmurs or rubs are noted.  Neuromuscular: Range of movement of the cervical spine is full. No crepitus is noted in the temporomandibular joints.  Skin: Extremities are without significant edema.  Neurologic Exam  Mental status: The patient is alert and oriented x 3 at the time of the examination. The patient has apparent normal recent and remote memory, with an  apparently normal attention span and concentration ability.  Cranial nerves: Facial symmetry is present. There is good sensation of the face to pinprick and soft touch bilaterally. The strength of the facial muscles and the muscles to head turning and shoulder shrug are normal bilaterally. Speech is well enunciated, no aphasia or dysarthria is noted. Extraocular movements are full. Visual fields are full. The tongue is midline, and the patient has symmetric elevation of the soft palate. No obvious hearing deficits are noted.  Motor: The motor testing reveals 5 over 5 strength of all 4 extremities. Good symmetric motor tone is noted throughout.  Sensory: Sensory testing is intact to pinprick, soft touch, vibration sensation, and position sense on all 4 extremities. No evidence of extinction is noted.  Coordination: Cerebellar testing reveals good finger-nose-finger and heel-to-shin bilaterally.  Gait and station: Gait is normal. Tandem gait is normal. Romberg is negative. No drift is seen.  Reflexes: Deep tendon reflexes are symmetric and normal bilaterally. Toes are downgoing bilaterally.   Assessment/Plan:  1. Common migraine headache  The patient is having frequent headaches at this time. She will be placed on Topamax, she is on birth control pills, and there may be some interaction between the two medications. I will place her on folic acid. She indicates that she may contact her OB physician concerning the birth control pills. The patient would like to get back into long-distance running, in the past, she has had episodes of near-syncope associated with prolonged running, with dimming of vision, without complete loss of consciousness, and occasional fecal incontinence. The patient will follow-up in 4 months.  Marlan Palau MD 04/09/2015 7:26 PM  [facility] [address] [place], [place] [address]  Phone [phone] Fax [phone]

## 2015-04-09 NOTE — Patient Instructions (Addendum)
We will start Topamax for the headache. Call if there are any concerns.   Topamax (topiramate) is a seizure medication that has an FDA approval for seizures and for migraine headache. Potential side effects of this medication include weight loss, cognitive slowing, tingling in the fingers and toes, and carbonated drinks will taste bad. If any significant side effects are noted on this drug, please contact our office.  Migraine Headache A migraine headache is an intense, throbbing pain on one or both sides of your head. A migraine can last for 30 minutes to several hours. CAUSES  The exact cause of a migraine headache is not always known. However, a migraine may be caused when nerves in the brain become irritated and release chemicals that cause inflammation. This causes pain. Certain things may also trigger migraines, such as:  Alcohol.  Smoking.  Stress.  Menstruation.  Aged cheeses.  Foods or drinks that contain nitrates, glutamate, aspartame, or tyramine.  Lack of sleep.  Chocolate.  Caffeine.  Hunger.  Physical exertion.  Fatigue.  Medicines used to treat chest pain (nitroglycerine), birth control pills, estrogen, and some blood pressure medicines. SIGNS AND SYMPTOMS  Pain on one or both sides of your head.  Pulsating or throbbing pain.  Severe pain that prevents daily activities.  Pain that is aggravated by any physical activity.  Nausea, vomiting, or both.  Dizziness.  Pain with exposure to bright lights, loud noises, or activity.  General sensitivity to bright lights, loud noises, or smells. Before you get a migraine, you may get warning signs that a migraine is coming (aura). An aura may include:  Seeing flashing lights.  Seeing bright spots, halos, or zigzag lines.  Having tunnel vision or blurred vision.  Having feelings of numbness or tingling.  Having trouble talking.  Having muscle weakness. DIAGNOSIS  A migraine headache is often  diagnosed based on:  Symptoms.  Physical exam.  A CT scan or MRI of your head. These imaging tests cannot diagnose migraines, but they can help rule out other causes of headaches. TREATMENT Medicines may be given for pain and nausea. Medicines can also be given to help prevent recurrent migraines.  HOME CARE INSTRUCTIONS  Only take over-the-counter or prescription medicines for pain or discomfort as directed by your health care provider. The use of long-term narcotics is not recommended.  Lie down in a dark, quiet room when you have a migraine.  Keep a journal to find out what may trigger your migraine headaches. For example, write down:  What you eat and drink.  How much sleep you get.  Any change to your diet or medicines.  Limit alcohol consumption.  Quit smoking if you smoke.  Get 7-9 hours of sleep, or as recommended by your health care provider.  Limit stress.  Keep lights dim if bright lights bother you and make your migraines worse. SEEK IMMEDIATE MEDICAL CARE IF:   Your migraine becomes severe.  You have a fever.  You have a stiff neck.  You have vision loss.  You have muscular weakness or loss of muscle control.  You start losing your balance or have trouble walking.  You feel faint or pass out.  You have severe symptoms that are different from your first symptoms. MAKE SURE YOU:   Understand these instructions.  Will watch your condition.  Will get help right away if you are not doing well or get worse. Document Released: 07/28/2005 Document Revised: 12/12/2013 Document Reviewed: 04/04/2013 [employer] Patient Information 2015  [employer]. This information is not intended to replace advice given to you by your health care provider. Make sure you discuss any questions you have with your health care provider.

## 2015-05-21 ENCOUNTER — Telehealth: Payer: Self-pay | Admitting: Neurology

## 2015-05-21 MED ORDER — TOPIRAMATE ER 50 MG PO CAP24: 50.0000 mg | ORAL_CAPSULE | Freq: Every day | ORAL | Status: DC

## 2015-05-21 NOTE — Telephone Encounter (Signed)
Patient called to advise the medication that Dr. Anne Hahn put her on at New Patient visit topiramate (TOPAMAX) 25 MG tablet helps with her migraines but the side effects make her tired, drowsy and foggy headed. Patient states that she's given the medication a good month and it's not any better. Would like to try a different medication.

## 2015-05-21 NOTE — Telephone Encounter (Signed)
I called patient. The Topamax seemed to be effective, but she is having side effects of cognitive slowing, and fatigue. I will try Trokendi 50 mg tablets to see if she can tolerate this better, if not we will have to switch her off to another medication.

## 2015-06-28 ENCOUNTER — Other Ambulatory Visit: Payer: Self-pay

## 2015-06-28 MED ORDER — TOPIRAMATE ER 50 MG PO CAP24: 50.0000 mg | ORAL_CAPSULE | Freq: Every day | ORAL | Status: DC

## 2015-07-19 ENCOUNTER — Ambulatory Visit (INDEPENDENT_AMBULATORY_CARE_PROVIDER_SITE_OTHER): Payer: No Typology Code available for payment source | Admitting: Neurology

## 2015-07-19 ENCOUNTER — Encounter: Payer: Self-pay | Admitting: Neurology

## 2015-07-19 VITALS — BP 108/74 | HR 73 | Ht 68.0 in | Wt 156.5 lb

## 2015-07-19 DIAGNOSIS — G43019 Migraine without aura, intractable, without status migrainosus: Secondary | ICD-10-CM | POA: Diagnosis not present

## 2015-07-19 MED ORDER — TOPIRAMATE ER 25 MG PO CAP24
75.0000 mg | ORAL_CAPSULE | Freq: Every day | ORAL | Status: DC
Start: 2015-07-19 — End: 2016-12-22

## 2015-07-19 NOTE — Progress Notes (Signed)
Reason for visit: Migraine headache  Gabrielle Foster is an 29 y.o. female  History of present illness:  Ms. Gabrielle Foster is a 29 year old right-handed white female with a history of migraine headache. The patient has been placed on Topamax, she takes Relpax if needed. The patient has had some improvement with the Topamax, but she could not tolerate the short acting preparation of the medication. She was switched to Trokendi at the 50 mg dose, she indicates that this is better tolerated, but it is a bit less effective than the short-acting drug. The patient does have tingly sensations, but she also has significant fatigue and some cognitive slowing on the medication. She has been under stress recently with an illness of her father, and switching job locations. The patient has gone from having 2 headaches a month to one or 2 headaches a week. The headaches may be disabling at times. Relpax generally does help the headache significantly if she takes it early. She is planning on becoming pregnant in May 2017. Currently, she is on folic acid. The patient returns for an evaluation.  Past Medical History  Diagnosis Date  . Headache(784.0)     Migraines  . Migraines   . Common migraine with intractable migraine 04/09/2015    Past Surgical History  Procedure Laterality Date  . Wisdom tooth extraction      Family History  Problem Relation Age of Onset  . Kidney disease Mother   . Hyperlipidemia Mother   . Heart disease Mother   . Hypertension Father   . Migraines Father   . Migraines Sister     Social history:  reports that she has never smoked. She has never used smokeless tobacco. She reports that she drinks alcohol. She reports that she does not use illicit drugs.   No Known Allergies  Medications:  Prior to Admission medications   Medication Sig Start Date End Date Taking? Authorizing Provider  eletriptan (RELPAX) 20 MG tablet Take 20 mg by mouth as needed for migraine  or headache. May repeat in 2 hours if headache persists or recurs.   Yes Historical Provider, MD  folic acid (FOLVITE) 1 MG tablet Take 1 tablet (1 mg total) by mouth daily. 04/09/15  Yes York Spanielharles K Jeannett Dekoning, MD  levonorgestrel-ethinyl estradiol (ORSYTHIA) 0.1-20 MG-MCG tablet Take 1 tablet by mouth daily.   Yes Historical Provider, MD  Topiramate ER 25 MG CP24 Take 75 mg by mouth at bedtime. 07/19/15   York Spanielharles K Jessye Imhoff, MD    ROS:  Out of a complete 14 system review of symptoms, the patient complains only of the following symptoms, and all other reviewed systems are negative.  Palpitations of the heart Headache  Blood pressure 108/74, pulse 73, height 5\' 8"  (1.727 m), weight 156 lb 8 oz (70.988 kg), not currently breastfeeding.  Physical Exam  General: The patient is alert and cooperative at the time of the examination.  Skin: No significant peripheral edema is noted.   Neurologic Exam  Mental status: The patient is alert and oriented x 3 at the time of the examination. The patient has apparent normal recent and remote memory, with an apparently normal attention span and concentration ability.   Cranial nerves: Facial symmetry is present. Speech is normal, no aphasia or dysarthria is noted. Extraocular movements are full. Visual fields are full.  Motor: The patient has good strength in all 4 extremities.  Sensory examination: Soft touch sensation is symmetric on the face, arms, and legs.  Coordination:  The patient has good finger-nose-finger and heel-to-shin bilaterally.  Gait and station: The patient has a normal gait. Tandem gait is normal. Romberg is negative. No drift is seen.  Reflexes: Deep tendon reflexes are symmetric.   Assessment/Plan:  1. Migraine headache  The patient is having an increase in headaches over the last 2 weeks. The patient is under a lot of stress, but she indicates that she continues to sleep well at night. The patient will be increased on the  long-acting Topamax taking 75 mg at night. If this is not well tolerated, we may consider a switch to amitriptyline. She may be planning pregnancy in May 2017. She will follow-up in 6 months, sooner if needed.  Marlan Palau MD 07/19/2015 7:37 PM  [facility] [address] [place], [place] [address]  Phone [phone] Fax [phone]

## 2015-07-19 NOTE — Patient Instructions (Signed)

## 2015-08-17 ENCOUNTER — Telehealth: Payer: Self-pay

## 2015-08-17 ENCOUNTER — Ambulatory Visit (INDEPENDENT_AMBULATORY_CARE_PROVIDER_SITE_OTHER): Payer: 59 | Admitting: Physician Assistant

## 2015-08-17 VITALS — BP 108/70 | HR 82 | Temp 97.6°F | Resp 18 | Ht 68.0 in | Wt 156.0 lb

## 2015-08-17 DIAGNOSIS — R059 Cough, unspecified: Secondary | ICD-10-CM

## 2015-08-17 DIAGNOSIS — R05 Cough: Secondary | ICD-10-CM | POA: Diagnosis not present

## 2015-08-17 DIAGNOSIS — J019 Acute sinusitis, unspecified: Secondary | ICD-10-CM

## 2015-08-17 MED ORDER — BENZONATATE 100 MG PO CAPS: 100.0000 mg | ORAL_CAPSULE | Freq: Three times a day (TID) | ORAL | Status: DC | PRN

## 2015-08-17 MED ORDER — AMOXICILLIN-POT CLAVULANATE 875-125 MG PO TABS: 1.0000 | ORAL_TABLET | Freq: Two times a day (BID) | ORAL | Status: DC

## 2015-08-17 MED ORDER — GUAIFENESIN-CODEINE 100-10 MG/5ML PO SOLN: 5.0000 mL | Freq: Four times a day (QID) | ORAL | Status: DC | PRN

## 2015-08-17 MED ORDER — PSEUDOEPHEDRINE HCL 60 MG PO TABS: 60.0000 mg | ORAL_TABLET | Freq: Four times a day (QID) | ORAL | Status: AC | PRN

## 2015-08-17 MED ORDER — IPRATROPIUM BROMIDE 0.03 % NA SOLN: 2.0000 | Freq: Two times a day (BID) | NASAL | Status: DC

## 2015-08-17 MED ORDER — AMOXICILLIN-POT CLAVULANATE 875-125 MG PO TABS: 1.0000 | ORAL_TABLET | Freq: Two times a day (BID) | ORAL | Status: AC

## 2015-08-17 MED FILL — IPRATROPIUM 0.03% SPRAY: 0.03 | 30 days supply | Qty: 30 | Fill #0

## 2015-08-17 NOTE — Patient Instructions (Signed)
Please try to hydrate with 64 oz of water throughout the day If you continue to have the symptoms after 3 more days, or you notice worsening symptoms (as we discussed), please start the antibiotic sooner.  Take to completion.   Sinusitis, Adult Sinusitis is redness, soreness, and inflammation of the paranasal sinuses. Paranasal sinuses are air pockets within the bones of your face. They are located beneath your eyes, in the middle of your forehead, and above your eyes. In healthy paranasal sinuses, mucus is able to drain out, and air is able to circulate through them by way of your nose. However, when your paranasal sinuses are inflamed, mucus and air can become trapped. This can allow bacteria and other germs to grow and cause infection. Sinusitis can develop quickly and last only a short time (acute) or continue over a long period (chronic). Sinusitis that lasts for more than 12 weeks is considered chronic. CAUSES Causes of sinusitis include:  Allergies.  Structural abnormalities, such as displacement of the cartilage that separates your nostrils (deviated septum), which can decrease the air flow through your nose and sinuses and affect sinus drainage.  Functional abnormalities, such as when the small hairs (cilia) that line your sinuses and help remove mucus do not work properly or are not present. SIGNS AND SYMPTOMS Symptoms of acute and chronic sinusitis are the same. The primary symptoms are pain and pressure around the affected sinuses. Other symptoms include:  Upper toothache.  Earache.  Headache.  Bad breath.  Decreased sense of smell and taste.  A cough, which worsens when you are lying flat.  Fatigue.  Fever.  Thick drainage from your nose, which often is green and may contain pus (purulent).  Swelling and warmth over the affected sinuses. DIAGNOSIS Your health care provider will perform a physical exam. During your exam, your health care provider may perform any of  the following to help determine if you have acute sinusitis or chronic sinusitis:  Look in your nose for signs of abnormal growths in your nostrils (nasal polyps).  Tap over the affected sinus to check for signs of infection.  View the inside of your sinuses using an imaging device that has a light attached (endoscope). If your health care provider suspects that you have chronic sinusitis, one or more of the following tests may be recommended:  Allergy tests.  Nasal culture. A sample of mucus is taken from your nose, sent to a lab, and screened for bacteria.  Nasal cytology. A sample of mucus is taken from your nose and examined by your health care provider to determine if your sinusitis is related to an allergy. TREATMENT Most cases of acute sinusitis are related to a viral infection and will resolve on their own within 10 days. Sometimes, medicines are prescribed to help relieve symptoms of both acute and chronic sinusitis. These may include pain medicines, decongestants, nasal steroid sprays, or saline sprays. However, for sinusitis related to a bacterial infection, your health care provider will prescribe antibiotic medicines. These are medicines that will help kill the bacteria causing the infection. Rarely, sinusitis is caused by a fungal infection. In these cases, your health care provider will prescribe antifungal medicine. For some cases of chronic sinusitis, surgery is needed. Generally, these are cases in which sinusitis recurs more than 3 times per year, despite other treatments. HOME CARE INSTRUCTIONS  Drink plenty of water. Water helps thin the mucus so your sinuses can drain more easily.  Use a humidifier.  Inhale steam  3-4 times a day (for example, sit in the bathroom with the shower running).  Apply a warm, moist washcloth to your face 3-4 times a day, or as directed by your health care provider.  Use saline nasal sprays to help moisten and clean your sinuses.  Take  medicines only as directed by your health care provider.  If you were prescribed either an antibiotic or antifungal medicine, finish it all even if you start to feel better. SEEK IMMEDIATE MEDICAL CARE IF:  You have increasing pain or severe headaches.  You have nausea, vomiting, or drowsiness.  You have swelling around your face.  You have vision problems.  You have a stiff neck.  You have difficulty breathing.   This information is not intended to replace advice given to you by your health care provider. Make sure you discuss any questions you have with your health care provider.   Document Released: 07/28/2005 Document Revised: 08/18/2014 Document Reviewed: 08/12/2011 Elsevier Interactive Patient Education [employer].

## 2015-08-17 NOTE — Telephone Encounter (Signed)
Patient was seen today   Requesting antibiotic and cough medication    [facility] on [address]   [phone]

## 2015-08-17 NOTE — Progress Notes (Signed)
[facility] [address], [place] [id number] [phone]  Date:  08/17/2015   Name:  Gabrielle Foster   DOB:  1986/07/09   MRN:  [id number]  PCP:  Farris HasMORROW, AARON, MD   Chief Complaint  Patient presents with  . Sinusitis    a few days now   . Facial Pain  . Ear Pain    left ear    History of Present Illness:  Gabrielle Foster is a 30 y.o. female patient who [facility] congestion, facial pain, and left ear pain for 4 days. Patient has concern of facial pain along her cheeks and radiating into left ear.  Congestion and rhinorrhea.  Yellow/green mucus is thick and more apparent in the morning.  She has no bleeding.  Cough is intermittent and worsens at night.  She is able to sleep with the help of nyquil.  Throat is scatchy.  Some left ear pain, but no hearing loss.  No fever.  She has also used mucinex sinus and pain which she states is helpful.  Patient states that her     Patient Active Problem List   Diagnosis Date Noted  . Common migraine with intractable migraine 04/09/2015  . Vacuum extractor delivery, delivered 08/15/2013  . Active labor 08/14/2013  . Transverse vaginal septum 07/07/2013  . Migraines 07/07/2013    Past Medical History  Diagnosis Date  . Headache(784.0)     Migraines  . Migraines   . Common migraine with intractable migraine 04/09/2015    Past Surgical History  Procedure Laterality Date  . Wisdom tooth extraction      Social History  Substance Use Topics  . Smoking status: Never Smoker   . Smokeless tobacco: Never Used  . Alcohol Use: 0.0 oz/week    0 Standard drinks or equivalent per week     Comment: social    Family History  Problem Relation Age of Onset  . Kidney disease Mother   . Hyperlipidemia Mother   . Heart disease Mother   . Hypertension Father   . Migraines Father   . Migraines Sister     No Known Allergies  Medication list has been reviewed and updated.  Current  Outpatient Prescriptions on File Prior to Visit  Medication Sig Dispense Refill  . eletriptan (RELPAX) 20 MG tablet Take 20 mg by mouth as needed for migraine or headache. May repeat in 2 hours if headache persists or recurs.    Marland Kitchen. levonorgestrel-ethinyl estradiol (ORSYTHIA) 0.1-20 MG-MCG tablet Take 1 tablet by mouth daily.    . Topiramate ER 25 MG CP24 Take 75 mg by mouth at bedtime. 90 capsule 4  . folic acid (FOLVITE) 1 MG tablet Take 1 tablet (1 mg total) by mouth daily. (Patient not taking: Reported on 08/17/2015) 30 tablet 5   No current facility-administered medications on file prior to visit.    ROS ROS otherwise unremarkable unless listed above.   Physical Examination: BP 108/70 mmHg  Pulse 82  Temp(Src) 97.6 F (36.4 C) (Oral)  Resp 18  Ht 5\' 8"  (1.727 m)  Wt 156 lb (70.761 kg)  BMI 23.73 kg/m2  SpO2 98%  LMP 07/27/2015 Ideal Body Weight: Weight in (lb) to have BMI = 25: 164.1  Physical Exam  Constitutional: She is oriented to person, place, and time. She appears well-developed and well-nourished. No distress.  HENT:  Head: Normocephalic and atraumatic.  Right Ear: Tympanic membrane, external ear and ear canal  normal.  Left Ear: External ear and ear canal normal. Tympanic membrane is bulging (bulging without erythema or perforation.).  Nose: Mucosal edema and rhinorrhea present. Right sinus exhibits no maxillary sinus tenderness and no frontal sinus tenderness. Left sinus exhibits no maxillary sinus tenderness and no frontal sinus tenderness.  Mouth/Throat: No uvula swelling. No oropharyngeal exudate, posterior oropharyngeal edema or posterior oropharyngeal erythema.  Eyes: Conjunctivae and EOM are normal. Pupils are equal, round, and reactive to light.  Cardiovascular: Normal rate and regular rhythm.  Exam reveals no gallop, no distant heart sounds and no friction rub.   No murmur heard. Pulmonary/Chest: Effort normal. No respiratory distress. She has no decreased breath  sounds. She has no wheezes. She has no rhonchi.  Lymphadenopathy:       Head (right side): No submandibular, no tonsillar, no preauricular and no posterior auricular adenopathy present.       Head (left side): No submandibular, no tonsillar, no preauricular and no posterior auricular adenopathy present.  Neurological: She is alert and oriented to person, place, and time.  Skin: She is not diaphoretic.  Psychiatric: She has a normal mood and affect. Her behavior is normal.     Assessment and Plan: ANAYAH ARVANITIS is a 30 y.o. female who is here today with sinus congestion, facial pain, and left ear discomfort.   -given augmentin printout to start in 3 days unless purulent mucus becomes more evident, fever, etc discussed.  We are approaching inclement weather, and want to avoid possiblity of this worsening without tretament. --given supportive treatment, and sudafed--she has used without adverse side effects.   Subacute sinusitis, unspecified location - Plan: pseudoephedrine (SUDAFED) 60 MG tablet, ipratropium (ATROVENT) 0.03 % nasal spray, benzonatate (TESSALON) 100 MG capsule, amoxicillin-clavulanate (AUGMENTIN) 875-125 MG tablet, DISCONTINUED: amoxicillin-clavulanate (AUGMENTIN) 875-125 MG tablet, DISCONTINUED: amoxicillin-clavulanate (AUGMENTIN) 875-125 MG tablet  Cough - Plan: benzonatate (TESSALON) 100 MG capsule, guaiFENesin-codeine 100-10 MG/5ML syrup    Trena Platt, PA-C [facility] 08/17/2015 9:50 AM

## 2015-08-20 MED FILL — RELPAX 40 MG TABLET: 40 | 30 days supply | Qty: 9 | Fill #0

## 2015-08-20 NOTE — Telephone Encounter (Signed)
rx sent to pharmacy. Patient notified. 

## 2015-08-24 ENCOUNTER — Ambulatory Visit: Payer: No Typology Code available for payment source | Admitting: Neurology

## 2015-09-23 ENCOUNTER — Ambulatory Visit (INDEPENDENT_AMBULATORY_CARE_PROVIDER_SITE_OTHER): Payer: 59 | Admitting: Family Medicine

## 2015-09-23 VITALS — BP 110/76 | HR 101 | Temp 97.7°F | Resp 18 | Ht 69.0 in | Wt 160.6 lb

## 2015-09-23 DIAGNOSIS — G43709 Chronic migraine without aura, not intractable, without status migrainosus: Secondary | ICD-10-CM | POA: Diagnosis not present

## 2015-09-23 DIAGNOSIS — R52 Pain, unspecified: Secondary | ICD-10-CM

## 2015-09-23 DIAGNOSIS — R059 Cough, unspecified: Secondary | ICD-10-CM

## 2015-09-23 DIAGNOSIS — J0101 Acute recurrent maxillary sinusitis: Secondary | ICD-10-CM

## 2015-09-23 DIAGNOSIS — R05 Cough: Secondary | ICD-10-CM | POA: Diagnosis not present

## 2015-09-23 LAB — POCT CBC
Granulocyte percent: 89.7 %G — AB (ref 37–80)
HCT, POC: 40.9 % (ref 37.7–47.9)
Hemoglobin: 13.8 g/dL (ref 12.2–16.2)
Lymph, poc: 0.5 — AB (ref 0.6–3.4)
MCH, POC: 29.1 pg (ref 27–31.2)
MCHC: 33.7 g/dL (ref 31.8–35.4)
MCV: 86.3 fL (ref 80–97)
MID (cbc): 0.4 (ref 0–0.9)
MPV: 9 fL (ref 0–99.8)
POC Granulocyte: 8.1 — AB (ref 2–6.9)
POC LYMPH PERCENT: 5.7 %L — AB (ref 10–50)
POC MID %: 4.6 %M (ref 0–12)
Platelet Count, POC: 186 10*3/uL (ref 142–424)
RBC: 4.74 M/uL (ref 4.04–5.48)
RDW, POC: 14.2 %
WBC: 9 10*3/uL (ref 4.6–10.2)

## 2015-09-23 LAB — POCT INFLUENZA A/B
Influenza A, POC: NEGATIVE
Influenza B, POC: NEGATIVE

## 2015-09-23 MED ORDER — BENZONATATE 100 MG PO CAPS: 100.0000 mg | ORAL_CAPSULE | Freq: Three times a day (TID) | ORAL | Status: DC | PRN

## 2015-09-23 MED ORDER — AZITHROMYCIN 250 MG PO TABS: ORAL_TABLET | ORAL | Status: DC

## 2015-09-23 MED ORDER — ELETRIPTAN HYDROBROMIDE 20 MG PO TABS: 20.0000 mg | ORAL_TABLET | ORAL | Status: DC | PRN

## 2015-09-23 MED ORDER — LORATADINE-PSEUDOEPHEDRINE ER 5-120 MG PO TB12: 1.0000 | ORAL_TABLET | Freq: Two times a day (BID) | ORAL | Status: DC

## 2015-09-23 MED ORDER — PREDNISONE 20 MG PO TABS: ORAL_TABLET | ORAL | Status: DC

## 2015-09-23 NOTE — Progress Notes (Signed)
Subjective:    Patient ID: Gabrielle Foster, female    DOB: 1986/01/11, 30 y.o.   MRN: [id number]  09/23/2015  Sinusitis; Emesis; Headache; and Generalized Body Aches   HPI This 30 y.o. female presents for evaluation of acute illness.  Started feeling acute symptoms three days ago.  Body aches; no fevers/chills/sweats.  +HA started last night; this morning has a migraine.  Took Robitussin rx provided last time.  Did not sleep well; took a Relpax at 4:30am; headache has worsened.  Now has nausea from migraine.  No ST; no ear pain; +rhinorrhea; +nasal congestion. +coughing.  No v/d.  Dayquil PRN.  S/p flu vaccine.  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  Treated 08/17/15 Augmentin, Sudafed, Atrovent nasal spray, Tessalon Perles for acute sinusitis.  Improved a little bit.  Convinced that had sinus infection; diagnosed with a cold; due to upcoming snow, took two doses of Augmentin and then stopped; good for a few days and then started feeling progressively worse.  Abx was helping so restarted Augmentin and finished it; felt much better while taking it.  Continued to feel well for several days after finishing it.    Migraine: Relpax is not touching.   Review of Systems  Constitutional: Negative for fever, chills, diaphoresis and fatigue.  HENT: Positive for congestion. Negative for ear pain, postnasal drip, rhinorrhea, sinus pressure, sore throat and trouble swallowing.   Eyes: Negative for photophobia and visual disturbance.  Respiratory: Positive for cough. Negative for shortness of breath and wheezing.   Cardiovascular: Negative for chest pain, palpitations and leg swelling.  Gastrointestinal: Positive for nausea. Negative for vomiting, abdominal pain, diarrhea and constipation.  Musculoskeletal: Positive for myalgias.  Neurological: Positive for headaches. Negative for dizziness, tremors, seizures, syncope, facial asymmetry, speech difficulty,  weakness, light-headedness and numbness.    Past Medical History  Diagnosis Date  . Headache(784.0)     Migraines  . Migraines   . Common migraine with intractable migraine 04/09/2015   Past Surgical History  Procedure Laterality Date  . Wisdom tooth extraction     No Known Allergies  Social History   Social History  . Marital Status: Married    Spouse Name: N/A  . Number of Children: 1  . Years of Education: master's   Occupational History  . [employer]    Social History Main Topics  . Smoking status: Never Smoker   . Smokeless tobacco: Never Used  . Alcohol Use: 0.0 oz/week    0 Standard drinks or equivalent per week     Comment: social  . Drug Use: No  . Sexual Activity: Yes   Other Topics Concern  . Not on file   Social History Narrative   Patient drinks 1-2 cups of caffeine daily.   Patient is right handed.   Family History  Problem Relation Age of Onset  . Kidney disease Mother   . Hyperlipidemia Mother   . Heart disease Mother   . Hypertension Father   . Migraines Father   . Migraines Sister        Objective:    BP 110/76 mmHg  Pulse 101  Temp(Src) 97.7 F (36.5 C) (Oral)  Resp 18  Ht 5\' 9"  (1.753 m)  Wt 160 lb 9.6 oz (72.848 kg)  BMI 23.71 kg/m2  SpO2 98%  LMP 09/08/2015 Physical Exam  Constitutional: She is oriented to person, place, and time. She appears well-developed and well-nourished. No distress.  HENT:  Head: Normocephalic and atraumatic.  Right Ear: Tympanic membrane, external ear and ear canal normal.  Left Ear: Tympanic membrane, external ear and ear canal normal.  Nose: Mucosal edema and rhinorrhea present. Right sinus exhibits maxillary sinus tenderness. Right sinus exhibits no frontal sinus tenderness. Left sinus exhibits maxillary sinus tenderness. Left sinus exhibits no frontal sinus tenderness.  Mouth/Throat: Uvula is midline, oropharynx is clear and moist and mucous membranes are normal.  Eyes:  Conjunctivae are normal. Pupils are equal, round, and reactive to light.  Neck: Normal range of motion. Neck supple.  Cardiovascular: Normal rate, regular rhythm and normal heart sounds.  Exam reveals no gallop and no friction rub.   No murmur heard. Pulmonary/Chest: Effort normal and breath sounds normal. She has no wheezes. She has no rales.  Hacking cough throughout exam.  Neurological: She is alert and oriented to person, place, and time. No cranial nerve deficit. She exhibits normal muscle tone. Coordination normal.  Skin: No rash noted. She is not diaphoretic.  Psychiatric: She has a normal mood and affect. Her behavior is normal. Judgment and thought content normal.  Nursing note and vitals reviewed.  Results for orders placed or performed in visit on 09/23/15  POCT CBC  Result Value Ref Range   WBC 9.0 4.6 - 10.2 K/uL   Lymph, poc 0.5 (A) 0.6 - 3.4   POC LYMPH PERCENT 5.7 (A) 10 - 50 %L   MID (cbc) 0.4 0 - 0.9   POC MID % 4.6 0 - 12 %M   POC Granulocyte 8.1 (A) 2 - 6.9   Granulocyte percent 89.7 (A) 37 - 80 %G   RBC 4.74 4.04 - 5.48 M/uL   Hemoglobin 13.8 12.2 - 16.2 g/dL   HCT, POC 16.1 09.6 - 47.9 %   MCV 86.3 80 - 97 fL   MCH, POC 29.1 27 - 31.2 pg   MCHC 33.7 31.8 - 35.4 g/dL   RDW, POC 04.5 %   Platelet Count, POC 186 142 - 424 K/uL   MPV 9.0 0 - 99.8 fL  POCT Influenza A/B  Result Value Ref Range   Influenza A, POC Negative Negative   Influenza B, POC Negative Negative       Assessment & Plan:   1. Acute recurrent maxillary sinusitis   2. Body aches   3. Chronic migraine without aura without status migrainosus, not intractable   4. Cough    -persistent. -rx for high dose Zithromax, Prednisone, Claritin D, Tessalon Perles. -restart atrovent nasal spray. -refill of Relpax provided; pt declined Toradol.   Orders Placed This Encounter  Procedures  . POCT CBC  . POCT Influenza A/B   Meds ordered this encounter  Medications  . eletriptan (RELPAX) 20 MG  tablet    Sig: Take 1 tablet (20 mg total) by mouth as needed for migraine or headache. May repeat in 2 hours if headache persists or recurs.    Dispense:  8 tablet    Refill:  3  . azithromycin (ZITHROMAX) 250 MG tablet    Sig: Two tablets daily x 5 days    Dispense:  10 each    Refill:  0  . predniSONE (DELTASONE) 20 MG tablet    Sig: Three tablets daily x 2 days then two tablets daily x 5 days then one tablet daily x 5 days    Dispense:  21 tablet    Refill:  0  . benzonatate (TESSALON) 100 MG capsule    Sig:  Take 1-2 capsules (100-200 mg total) by mouth 3 (three) times daily as needed for cough.    Dispense:  60 capsule    Refill:  0  . loratadine-pseudoephedrine (CLARITIN-D 12-HOUR) 5-120 MG tablet    Sig: Take 1 tablet by mouth 2 (two) times daily.    Dispense:  20 tablet    Refill:  0    No Follow-up on file.    Seila Liston Paulita Fujita, M.D. [facility] [address] [place], [place]  [address] [phone] phone [phone] fax

## 2015-09-23 NOTE — Patient Instructions (Signed)
1.  Restart Atrovent nasal spray 2 sprays into each nostril daily. 2.  Take Zithromax and Prednisone as prescribed. 3.  Use Tessalon Perles for cough. 4.  Take Claritin D for nasal congestion.  Sinusitis, Adult Sinusitis is redness, soreness, and inflammation of the paranasal sinuses. Paranasal sinuses are air pockets within the bones of your face. They are located beneath your eyes, in the middle of your forehead, and above your eyes. In healthy paranasal sinuses, mucus is able to drain out, and air is able to circulate through them by way of your nose. However, when your paranasal sinuses are inflamed, mucus and air can become trapped. This can allow bacteria and other germs to grow and cause infection. Sinusitis can develop quickly and last only a short time (acute) or continue over a long period (chronic). Sinusitis that lasts for more than 12 weeks is considered chronic. CAUSES Causes of sinusitis include:  Allergies.  Structural abnormalities, such as displacement of the cartilage that separates your nostrils (deviated septum), which can decrease the air flow through your nose and sinuses and affect sinus drainage.  Functional abnormalities, such as when the small hairs (cilia) that line your sinuses and help remove mucus do not work properly or are not present. SIGNS AND SYMPTOMS Symptoms of acute and chronic sinusitis are the same. The primary symptoms are pain and pressure around the affected sinuses. Other symptoms include:  Upper toothache.  Earache.  Headache.  Bad breath.  Decreased sense of smell and taste.  A cough, which worsens when you are lying flat.  Fatigue.  Fever.  Thick drainage from your nose, which often is green and may contain pus (purulent).  Swelling and warmth over the affected sinuses. DIAGNOSIS Your health care provider will perform a physical exam. During your exam, your health care provider may perform any of the following to help determine if  you have acute sinusitis or chronic sinusitis:  Look in your nose for signs of abnormal growths in your nostrils (nasal polyps).  Tap over the affected sinus to check for signs of infection.  View the inside of your sinuses using an imaging device that has a light attached (endoscope). If your health care provider suspects that you have chronic sinusitis, one or more of the following tests may be recommended:  Allergy tests.  Nasal culture. A sample of mucus is taken from your nose, sent to a lab, and screened for bacteria.  Nasal cytology. A sample of mucus is taken from your nose and examined by your health care provider to determine if your sinusitis is related to an allergy. TREATMENT Most cases of acute sinusitis are related to a viral infection and will resolve on their own within 10 days. Sometimes, medicines are prescribed to help relieve symptoms of both acute and chronic sinusitis. These may include pain medicines, decongestants, nasal steroid sprays, or saline sprays. However, for sinusitis related to a bacterial infection, your health care provider will prescribe antibiotic medicines. These are medicines that will help kill the bacteria causing the infection. Rarely, sinusitis is caused by a fungal infection. In these cases, your health care provider will prescribe antifungal medicine. For some cases of chronic sinusitis, surgery is needed. Generally, these are cases in which sinusitis recurs more than 3 times per year, despite other treatments. HOME CARE INSTRUCTIONS  Drink plenty of water. Water helps thin the mucus so your sinuses can drain more easily.  Use a humidifier.  Inhale steam 3-4 times a day (for example,  sit in the bathroom with the shower running).  Apply a warm, moist washcloth to your face 3-4 times a day, or as directed by your health care provider.  Use saline nasal sprays to help moisten and clean your sinuses.  Take medicines only as directed by your  health care provider.  If you were prescribed either an antibiotic or antifungal medicine, finish it all even if you start to feel better. SEEK IMMEDIATE MEDICAL CARE IF:  You have increasing pain or severe headaches.  You have nausea, vomiting, or drowsiness.  You have swelling around your face.  You have vision problems.  You have a stiff neck.  You have difficulty breathing.   This information is not intended to replace advice given to you by your health care provider. Make sure you discuss any questions you have with your health care provider.   Document Released: 07/28/2005 Document Revised: 08/18/2014 Document Reviewed: 08/12/2011 Elsevier Interactive Patient Education [employer].

## 2015-09-28 MED FILL — RELPAX 40 MG TABLET: 40 | 30 days supply | Qty: 9 | Fill #1

## 2016-01-17 ENCOUNTER — Telehealth: Payer: Self-pay | Admitting: *Deleted

## 2016-01-17 ENCOUNTER — Ambulatory Visit: Payer: No Typology Code available for payment source | Admitting: Adult Health

## 2016-01-17 NOTE — Telephone Encounter (Signed)
Pt no showed appt today

## 2016-01-18 ENCOUNTER — Encounter: Payer: Self-pay | Admitting: Adult Health

## 2016-02-20 ENCOUNTER — Emergency Department (HOSPITAL_COMMUNITY): Payer: PRIVATE HEALTH INSURANCE

## 2016-02-20 ENCOUNTER — Encounter (HOSPITAL_COMMUNITY): Payer: Self-pay | Admitting: Nurse Practitioner

## 2016-02-20 ENCOUNTER — Emergency Department (HOSPITAL_COMMUNITY)
Admission: EM | Admit: 2016-02-20 | Discharge: 2016-02-21 | Disposition: A | Discharge status: Home / Self Care | Payer: PRIVATE HEALTH INSURANCE | Attending: Emergency Medicine | Admitting: Emergency Medicine

## 2016-02-20 DIAGNOSIS — R51 Headache: Secondary | ICD-10-CM | POA: Insufficient documentation

## 2016-02-20 DIAGNOSIS — M791 Myalgia: Secondary | ICD-10-CM | POA: Diagnosis not present

## 2016-02-20 DIAGNOSIS — R519 Headache, unspecified: Secondary | ICD-10-CM

## 2016-02-20 DIAGNOSIS — M542 Cervicalgia: Secondary | ICD-10-CM | POA: Insufficient documentation

## 2016-02-20 DIAGNOSIS — Z79818 Long term (current) use of other agents affecting estrogen receptors and estrogen levels: Secondary | ICD-10-CM | POA: Insufficient documentation

## 2016-02-20 DIAGNOSIS — R6883 Chills (without fever): Secondary | ICD-10-CM | POA: Insufficient documentation

## 2016-02-20 DIAGNOSIS — Z79899 Other long term (current) drug therapy: Secondary | ICD-10-CM | POA: Insufficient documentation

## 2016-02-20 DIAGNOSIS — H53149 Visual discomfort, unspecified: Secondary | ICD-10-CM | POA: Diagnosis not present

## 2016-02-20 DIAGNOSIS — T701XXA Sinus barotrauma, initial encounter: Secondary | ICD-10-CM | POA: Insufficient documentation

## 2016-02-20 LAB — COMPREHENSIVE METABOLIC PANEL
ALT: 12 U/L — ABNORMAL LOW (ref 14–54)
AST: 17 U/L (ref 15–41)
Albumin: 4 g/dL (ref 3.5–5.0)
Alkaline Phosphatase: 37 U/L — ABNORMAL LOW (ref 38–126)
Anion gap: 7 (ref 5–15)
BUN: 8 mg/dL (ref 6–20)
CO2: 21 mmol/L — ABNORMAL LOW (ref 22–32)
Calcium: 8.9 mg/dL (ref 8.9–10.3)
Chloride: 108 mmol/L (ref 101–111)
Creatinine, Ser: 0.69 mg/dL (ref 0.44–1.00)
GFR calc Af Amer: >60 mL/min (ref >60)
GFR calc non Af Amer: >60 mL/min (ref >60)
Glucose, Bld: 84 mg/dL (ref 65–99)
Potassium: 3.6 mmol/L (ref 3.5–5.1)
Sodium: 136 mmol/L (ref 135–145)
Total Bilirubin: 0.7 mg/dL (ref 0.3–1.2)
Total Protein: 7.6 g/dL (ref 6.5–8.1)

## 2016-02-20 LAB — CBC WITH DIFFERENTIAL/PLATELET
Basophils Absolute: 0 10*3/uL (ref 0.0–0.1)
Basophils Relative: 0 %
Eosinophils Absolute: 0 10*3/uL (ref 0.0–0.7)
Eosinophils Relative: 0 %
HCT: 40.3 % (ref 36.0–46.0)
Hemoglobin: 13.4 g/dL (ref 12.0–15.0)
Lymphocytes Relative: 20 %
Lymphs Abs: 1.7 10*3/uL (ref 0.7–4.0)
MCH: 29.2 pg (ref 26.0–34.0)
MCHC: 33.3 g/dL (ref 30.0–36.0)
MCV: 87.8 fL (ref 78.0–100.0)
Monocytes Absolute: 0.7 10*3/uL (ref 0.1–1.0)
Monocytes Relative: 8 %
Neutro Abs: 6.1 10*3/uL (ref 1.7–7.7)
Neutrophils Relative %: 72 %
Platelets: 179 10*3/uL (ref 150–400)
RBC: 4.59 MIL/uL (ref 3.87–5.11)
RDW: 14.1 % (ref 11.5–15.5)
WBC: 8.5 10*3/uL (ref 4.0–10.5)

## 2016-02-20 LAB — URINALYSIS, ROUTINE W REFLEX MICROSCOPIC
Bilirubin Urine: NEGATIVE
Glucose, UA: NEGATIVE mg/dL
Ketones, ur: >80 mg/dL — AB
Leukocytes, UA: NEGATIVE
Nitrite: NEGATIVE
Protein, ur: NEGATIVE mg/dL
Specific Gravity, Urine: 1.02 (ref 1.005–1.030)
pH: 7 (ref 5.0–8.0)

## 2016-02-20 LAB — URINE MICROSCOPIC-ADD ON

## 2016-02-20 LAB — I-STAT CG4 LACTIC ACID, ED: Lactic Acid, Venous: 1.12 mmol/L (ref 0.5–1.9)

## 2016-02-20 MED ORDER — METHYLPREDNISOLONE SODIUM SUCC 125 MG IJ SOLR
125.0000 mg | Freq: Once | INTRAMUSCULAR | Status: AC
  Administered 2016-02-21: 125 mg via INTRAVENOUS
  Filled 2016-02-20: qty 2

## 2016-02-20 MED ORDER — DIPHENHYDRAMINE HCL 50 MG/ML IJ SOLN
25.0000 mg | Freq: Once | INTRAMUSCULAR | Status: AC
  Administered 2016-02-21: 25 mg via INTRAVENOUS
  Filled 2016-02-20: qty 1

## 2016-02-20 MED ORDER — METOCLOPRAMIDE HCL 5 MG/ML IJ SOLN
10.0000 mg | Freq: Once | INTRAMUSCULAR | Status: AC
  Administered 2016-02-21: 10 mg via INTRAVENOUS
  Filled 2016-02-20: qty 2

## 2016-02-20 MED ORDER — SODIUM CHLORIDE 0.9 % IV BOLUS (SEPSIS)
2000.0000 mL | Freq: Once | INTRAVENOUS | Status: AC
  Administered 2016-02-21: 2000 mL via INTRAVENOUS

## 2016-02-20 MED ORDER — KETOROLAC TROMETHAMINE 30 MG/ML IJ SOLN
30.0000 mg | Freq: Once | INTRAMUSCULAR | Status: AC
  Administered 2016-02-21: 30 mg via INTRAVENOUS
  Filled 2016-02-20: qty 1

## 2016-02-20 NOTE — ED Provider Notes (Signed)
CSN: [id number]     Arrival date & time 02/20/16  1810 History  By signing my name below, I, Bridgette Habermann, attest that this documentation has been prepared under the direction and in the presence of Loren Racer, MD. Electronically Signed: Bridgette Habermann, ED Scribe. 02/20/2016. 11:37 PM.   Chief Complaint  Patient presents with  . Chills  . Nuchal Rigidity    . Headache    The history is provided by the patient. No language interpreter was used.    HPI Comments: MAHOGANY TORRANCE is a 30 y.o. female who presents to the Emergency Department with history of chronic migraines and recurrent sinusitis complaining of gradual onset, constant, facial headache starting yesterday and then worsened today. Pt also has associated photophobia, chills, and generalized body aches. Pt notes that she also had neck pn which has gradually improved since onset. States this feels like the worst sinus infection she's ever had. Pt called her PCP and sent her to urgent care who sent her here. Pt has tried Ibuprofen, Tylenol, and Excedrin Migraine with mild relief. Patient was recently started on Keflex for concern for possible infected insect bite to the left upper shoulder. States that that has improved. No other rashes. Pt denies nausea, vomiting, diaphoresis, congestion, rhinorrhea, cough, sore throat, and fever.   Past Medical History  Diagnosis Date  . Headache(784.0)     Migraines  . Migraines   . Common migraine with intractable migraine 04/09/2015   Past Surgical History  Procedure Laterality Date  . Wisdom tooth extraction     Family History  Problem Relation Age of Onset  . Kidney disease Mother   . Hyperlipidemia Mother   . Heart disease Mother   . Hypertension Father   . Migraines Father   . Migraines Sister    Social History  Substance Use Topics  . Smoking status: Never Smoker   . Smokeless tobacco: Never Used  . Alcohol Use: 0.0 oz/week    0 Standard drinks or equivalent per week      Comment: social   OB History    Gravida Para Term Preterm AB TAB SAB Ectopic Multiple Living   Review of Systems  Constitutional: Positive for chills. Negative for fever and appetite change.  HENT: Positive for sinus pressure. Negative for facial swelling and sore throat.   Eyes: Positive for photophobia. Negative for visual disturbance.  Respiratory: Negative for cough and shortness of breath.   Cardiovascular: Negative for chest pain, palpitations and leg swelling.  Gastrointestinal: Negative for nausea, vomiting, abdominal pain and diarrhea.  Genitourinary: Negative for dysuria, frequency and flank pain.  Musculoskeletal: Positive for myalgias and neck pain. Negative for arthralgias and neck stiffness.  Skin: Positive for wound. Negative for rash.  Neurological: Positive for headaches. Negative for dizziness, syncope, weakness, light-headedness and numbness.  All other systems reviewed and are negative.      Allergies  Review of patient's allergies indicates no known allergies.  Home Medications   Prior to Admission medications   Medication Sig Start Date End Date Taking? Authorizing Provider  cyanocobalamin 1000 MCG tablet Take 1,000 mcg by mouth daily.   Yes Historical Provider, MD  levonorgestrel-ethinyl estradiol (ORSYTHIA) 0.1-20 MG-MCG tablet Take 1 tablet by mouth daily.   Yes Historical Provider, MD  magnesium 30 MG tablet Take 30 mg by mouth daily.   Yes Historical Provider, MD  azithromycin (ZITHROMAX) 250 MG tablet Two  tablets daily x 5 days Patient not taking: Reported on 02/20/2016 09/23/15   Ethelda ChickKristi M Smith, MD  benzonatate (TESSALON) 100 MG capsule Take 1-2 capsules (100-200 mg total) by mouth 3 (three) times daily as needed for cough. Patient not taking: Reported on 02/20/2016 09/23/15   Ethelda ChickKristi M Smith, MD  eletriptan (RELPAX) 20 MG tablet Take 1 tablet (20 mg total) by mouth as needed for migraine or headache. May repeat in 2 hours if headache  persists or recurs. 09/23/15   Ethelda ChickKristi M Smith, MD  folic acid (FOLVITE) 1 MG tablet Take 1 tablet (1 mg total) by mouth daily. Patient not taking: Reported on 08/17/2015 04/09/15   York Spanielharles K Willis, MD  guaiFENesin-codeine 100-10 MG/5ML syrup Take 5 mLs by mouth every 6 (six) hours as needed for cough. Patient not taking: Reported on 09/23/2015 08/17/15   Collie SiadStephanie D English, PA  ipratropium (ATROVENT) 0.03 % nasal spray Place 2 sprays into both nostrils 2 (two) times daily. Patient not taking: Reported on 02/20/2016 08/17/15   Collie SiadStephanie D English, PA  loratadine (CLARITIN) 10 MG tablet Take 1 tablet (10 mg total) by mouth daily. 02/21/16   Loren Raceravid Grainger Mccarley, MD  loratadine-pseudoephedrine (CLARITIN-D 12-HOUR) 5-120 MG tablet Take 1 tablet by mouth 2 (two) times daily. Patient not taking: Reported on 02/20/2016 09/23/15   Ethelda ChickKristi M Smith, MD  mometasone (NASONEX) 50 MCG/ACT nasal spray Place 2 sprays into the nose daily. 02/21/16   Loren Raceravid Amaury Kuzel, MD  oxymetazoline (AFRIN NASAL SPRAY) 0.05 % nasal spray Place 1 spray into both nostrils 2 (two) times daily. Use no more than 3 days in a row 02/21/16   Loren Raceravid Marites Nath, MD  predniSONE (DELTASONE) 20 MG tablet Three tablets daily x 2 days then two tablets daily x 5 days then one tablet daily x 5 days Patient not taking: Reported on 02/20/2016 09/23/15   Ethelda ChickKristi M Smith, MD  Topiramate ER 25 MG CP24 Take 75 mg by mouth at bedtime. Patient not taking: Reported on 02/20/2016 07/19/15   York Spanielharles K Willis, MD   BP 103/70 mmHg  Pulse 85  Temp(Src) 97.5 F (36.4 C) (Oral)  Resp 16  Ht 5\' 8"  (1.727 m)  Wt 162 lb (73.483 kg)  BMI 24.64 kg/m2  SpO2 99%  LMP 01/21/2016 Physical Exam  Constitutional: She is oriented to person, place, and time. She appears well-developed and well-nourished. No distress.  Patient is in no distress.  HENT:  Head: Normocephalic and atraumatic.  Mouth/Throat: Oropharynx is clear and moist.  Bilateral maxillary sinus tenderness with percussion.  Oropharynx is clear  Eyes: EOM are normal. Pupils are equal, round, and reactive to light.  Neck: Normal range of motion. Neck supple.  Minimal midline cervical tenderness with palpation. There is no step-offs or deformity. No nuchal rigidity  Cardiovascular: Normal rate and regular rhythm.  Exam reveals no gallop and no friction rub.   No murmur heard. Pulmonary/Chest: Effort normal and breath sounds normal. No respiratory distress. She has no wheezes. She has no rales.  Abdominal: Soft. Bowel sounds are normal. She exhibits no distension and no mass. There is no tenderness. There is no rebound and no guarding.  Musculoskeletal: Normal range of motion. She exhibits no edema or tenderness.  No lower extremity swelling or asymmetry. No joint effusions or redness. Distal pulses are equal  Lymphadenopathy:    She has no cervical adenopathy.  Neurological: She is alert and oriented to person, place, and time.  5/5 motor in all extremities. Sensation is intact.  Skin: Skin is warm and dry. Rash noted. No erythema.  Patient has small papule to the left anterior shoulder with minimal surrounding erythema. No tenderness, fluctuance or masses.  Psychiatric: She has a normal mood and affect. Her behavior is normal.  Nursing note and vitals reviewed.   ED Course  Procedures  DIAGNOSTIC STUDIES: Oxygen Saturation is 99% on RA, normal by my interpretation.    COORDINATION OF CARE: 11:35 PM Discussed treatment plan with pt at bedside which includes pain medication and pt agreed to plan.  Labs Review Labs Reviewed  COMPREHENSIVE METABOLIC PANEL - Abnormal; Notable for the following:    CO2 21 (*)    ALT 12 (*)    Alkaline Phosphatase 37 (*)    All other components within normal limits  URINALYSIS, ROUTINE W REFLEX MICROSCOPIC (NOT AT [facility]) - Abnormal; Notable for the following:    Hgb urine dipstick TRACE (*)    Ketones, ur >80 (*)    All other components within normal limits  URINE  MICROSCOPIC-ADD ON - Abnormal; Notable for the following:    Squamous Epithelial / LPF 6-30 (*)    Bacteria, UA FEW (*)    All other components within normal limits  CULTURE, BLOOD (ROUTINE X 2)  CULTURE, BLOOD (ROUTINE X 2)  URINE CULTURE  CBC WITH DIFFERENTIAL/PLATELET  PREGNANCY, URINE  I-STAT CG4 LACTIC ACID, ED  I-STAT CG4 LACTIC ACID, ED    Imaging Review Dg Chest 2 View  02/20/2016  CLINICAL DATA:  30 year old female with sepsis EXAM: CHEST  2 VIEW COMPARISON:  None. FINDINGS: The heart size and mediastinal contours are within normal limits. Both lungs are clear. The visualized skeletal structures are unremarkable. IMPRESSION: No active cardiopulmonary disease. Electronically Signed   By: Elgie Collard M.D.   On: 02/20/2016 20:44   I have personally reviewed and evaluated these images and lab results as part of my medical decision-making.   EKG Interpretation None      MDM   Final diagnoses:  Nonintractable headache, unspecified chronicity pattern, unspecified headache type  Neck pain   I personally performed the services described in this documentation, which was scribed in my presence. The recorded information has been reviewed and is accurate.    Patient is very well-appearing. She has a normal neurologic exam. No evidence of meningitis. She has a normal white blood cell count is been afebrile. Discussed treatment options and possible LP. Agrees to delaying LP and treating with IV fluids and migraine cocktail. Patient will be reevaluated.   Patient says she's feeling much better at this time. Headache is resolved. No nuchal rigidity. Return precautions given.  Loren Racer, MD 02/22/16 [phone]

## 2016-02-20 NOTE — ED Notes (Signed)
[facility] with [facility], pt c/o severe body aches and headache, has been taking abx/Keflex for an insect bite but symptoms have worsened.

## 2016-02-20 NOTE — ED Notes (Signed)
Pt called 3 times w/o response.  It is assumed that the Pt left.

## 2016-02-20 NOTE — ED Notes (Signed)
Pt just walked up Registration asking "how much longer?"  Pt un-discharged.

## 2016-02-21 LAB — I-STAT CG4 LACTIC ACID, ED: Lactic Acid, Venous: 0.84 mmol/L (ref 0.5–1.9)

## 2016-02-21 LAB — PREGNANCY, URINE: Preg Test, Ur: NEGATIVE

## 2016-02-21 MED ORDER — MOMETASONE FUROATE 50 MCG/ACT NA SUSP: 2.0000 | Freq: Every day | NASAL | Status: DC

## 2016-02-21 MED ORDER — OXYMETAZOLINE HCL 0.05 % NA SOLN: 1.0000 | Freq: Two times a day (BID) | NASAL | Status: DC

## 2016-02-21 MED ORDER — LORATADINE 10 MG PO TABS: 10.0000 mg | ORAL_TABLET | Freq: Every day | ORAL | Status: DC

## 2016-02-21 NOTE — ED Notes (Signed)
Patient d/c'd self care.  F/U and mediations reviewed.  Patient verbalized understanding. 

## 2016-02-21 NOTE — Discharge Instructions (Signed)
Sinus Headache A sinus headache occurs when the paranasal sinuses become clogged or swollen. Paranasal sinuses are air pockets within the bones of the face. Sinus headaches can range from mild to severe. CAUSES A sinus headache can result from various conditions that affect the sinuses, such as:  Colds.  Sinus infections.  Allergies. SYMPTOMS The main symptom of this condition is a headache that may feel like pain or pressure in the face, forehead, ears, or upper teeth. People who have a sinus headache often have other symptoms, such as:  Congested or runny nose.  Fever.  Inability to smell. Weather changes can make symptoms worse. DIAGNOSIS This condition may be diagnosed based on:  A physical exam and medical history.  Imaging tests, such as a CT scan and MRI, to check for problems with the sinuses.  A specialist may look into the sinuses with a tool that has a camera (endoscopy). TREATMENT Treatment for this condition depends on the cause.  Sinus pain that is caused by a sinus infection may be treated with antibiotic medicine.  Sinus pain that is caused by allergies may be helped by allergy medicines (antihistamines) and medicated nasal sprays.  Sinus pain that is caused by congestion may be helped by flushing the nose and sinuses with saline solution. HOME CARE INSTRUCTIONS  Take medicines only as directed by your health care provider.  If you were prescribed an antibiotic medicine, finish all of it even if you start to feel better.  If you have congestion, use a nasal spray to help reduce pressure.  If directed, apply a warm, moist washcloth to your face to help relieve pain. SEEK MEDICAL CARE IF:  You have headaches more than one time each week.  You have sensitivity to light or sound.  You have a fever.  You feel sick to your stomach (nauseous) or you throw up (vomit).  Your headaches do not get better with treatment. Many people think that they have a  sinus headache when they actually have migraines or tension headaches. SEEK IMMEDIATE MEDICAL CARE IF:  You have vision problems.  You have sudden, severe pain in your face or head.  You have a seizure.  You are confused.  You have a stiff neck.   This information is not intended to replace advice given to you by your health care provider. Make sure you discuss any questions you have with your health care provider.   Document Released: 09/04/2004 Document Revised: 12/12/2014 Document Reviewed: 07/24/2014 Elsevier Interactive Patient Education 2016 [employer] Inc[employer]  

## 2016-02-21 NOTE — ED Notes (Signed)
Patient ambulatory to restroom  ?

## 2016-02-22 LAB — URINE CULTURE

## 2016-02-25 LAB — CULTURE, BLOOD (ROUTINE X 2)
Culture: NO GROWTH
Culture: NO GROWTH

## 2016-05-13 LAB — OB RESULTS CONSOLE ABO/RH: RH Type: POSITIVE

## 2016-05-13 LAB — OB RESULTS CONSOLE RPR: RPR: NONREACTIVE

## 2016-05-13 LAB — OB RESULTS CONSOLE GC/CHLAMYDIA
Chlamydia: NEGATIVE
Gonorrhea: NEGATIVE

## 2016-05-13 LAB — OB RESULTS CONSOLE ANTIBODY SCREEN: Antibody Screen: NEGATIVE

## 2016-05-13 LAB — OB RESULTS CONSOLE RUBELLA ANTIBODY, IGM: Rubella: IMMUNE

## 2016-05-13 LAB — OB RESULTS CONSOLE HIV ANTIBODY (ROUTINE TESTING): HIV: NONREACTIVE

## 2016-05-13 LAB — OB RESULTS CONSOLE HEPATITIS B SURFACE ANTIGEN: Hepatitis B Surface Ag: NEGATIVE

## 2016-08-11 NOTE — L&D Delivery Note (Signed)
Delivery Note At 12:49 AM a viable female was delivered via Vaginal, Spontaneous Delivery (Presentation: vtx; LOA ).  APGAR: 9, 9; weight pending .   Placenta status: spontaneous, intact.  Cord:  with the following complications: none.   Anesthesia:  Epidural Episiotomy: None Lacerations: 2nd degree Suture Repair: 3.0 vicryl rapide Est. Blood Loss (mL):  200  Mom to postpartum.  Baby to Couplet care / Skin to Skin.  Leighton Roachodd D Huntley Demedeiros 12/24/2016, 1:14 AM

## 2016-11-21 LAB — OB RESULTS CONSOLE GBS: GBS: NEGATIVE

## 2016-12-22 ENCOUNTER — Encounter (HOSPITAL_COMMUNITY): Payer: Self-pay | Admitting: *Deleted

## 2016-12-22 ENCOUNTER — Inpatient Hospital Stay (HOSPITAL_COMMUNITY)
Admission: AD | Admit: 2016-12-22 | Discharge: 2016-12-22 | Disposition: A | Discharge status: Home / Self Care | Payer: Managed Care, Other (non HMO) | Source: Ambulatory Visit | Attending: Obstetrics and Gynecology | Admitting: Obstetrics and Gynecology

## 2016-12-22 DIAGNOSIS — O471 False labor at or after 37 completed weeks of gestation: Secondary | ICD-10-CM

## 2016-12-22 NOTE — MAU Note (Signed)
Pt came in with c/o of contractions every 4 minutes.  Cervical exam is 2/50/ballotable.  Dr. Senaida Oresichardson updated.  Will continue to monitor.   Estanislado EmmsAshley Schwarz RN

## 2016-12-22 NOTE — MAU Provider Note (Signed)
Pt seen in MAU for labor evaluation.  Contractions irregular, every 3-7 minutes.  Cervix unchanged at 2/50/-3.  FHR tracing reviewed and category 1  Pt offered to stay for 2 hours and recheck or go home and chooses to go home.

## 2016-12-22 NOTE — MAU Note (Signed)
RN has communicated with Dr. Senaida Oresichardson and reviewed vital signs:  Vitals:   12/22/16 1921 12/22/16 1923  BP: 110/79 110/79  Pulse: 93 93  Resp: 18   Temp: 98 F (36.7 C)     Vaginal exam:  Dilation: 2 Effacement (%): Thick Station: -3 Presentation: Vertex Exam by:: AThersa Salt. Schwarz RN ,   Also reviewed contraction pattern and that non-stress test is reactive.  It has been documented that patient is contracting every 2-7 minutes with no cervical change from last exam, not indicating active labor.  Patient denies any other complaints.  Based on this report provider has given order for discharge.  A discharge order and diagnosis entered by a provider.   Labor discharge instructions reviewed with patient.

## 2016-12-22 NOTE — Discharge Instructions (Signed)

## 2016-12-22 NOTE — MAU Note (Signed)
Patient reports contractions every 4-5 minutes apart for the past 2 hours. Denies LOF or VB at this time. +FM. Last Thursday patient was 2cm. +nausea. BM today

## 2016-12-23 ENCOUNTER — Inpatient Hospital Stay (HOSPITAL_COMMUNITY): Payer: Managed Care, Other (non HMO) | Admitting: Anesthesiology

## 2016-12-23 ENCOUNTER — Encounter (HOSPITAL_COMMUNITY): Payer: Self-pay | Admitting: *Deleted

## 2016-12-23 ENCOUNTER — Inpatient Hospital Stay (HOSPITAL_COMMUNITY)
Admission: AD | Admit: 2016-12-23 | Discharge: 2016-12-25 | DRG: 775 | Disposition: A | Discharge status: Home / Self Care | Payer: Managed Care, Other (non HMO) | Source: Ambulatory Visit | Attending: Obstetrics and Gynecology | Admitting: Obstetrics and Gynecology

## 2016-12-23 ENCOUNTER — Telehealth (HOSPITAL_COMMUNITY): Payer: Self-pay | Admitting: *Deleted

## 2016-12-23 DIAGNOSIS — Z3A40 40 weeks gestation of pregnancy: Secondary | ICD-10-CM

## 2016-12-23 DIAGNOSIS — Z3493 Encounter for supervision of normal pregnancy, unspecified, third trimester: Secondary | ICD-10-CM | POA: Diagnosis present

## 2016-12-23 DIAGNOSIS — Z8249 Family history of ischemic heart disease and other diseases of the circulatory system: Secondary | ICD-10-CM | POA: Diagnosis not present

## 2016-12-23 LAB — CBC
HCT: 32.7 % — ABNORMAL LOW (ref 36.0–46.0)
Hemoglobin: 10.3 g/dL — ABNORMAL LOW (ref 12.0–15.0)
MCH: 25.8 pg — ABNORMAL LOW (ref 26.0–34.0)
MCHC: 31.5 g/dL (ref 30.0–36.0)
MCV: 81.8 fL (ref 78.0–100.0)
Platelets: 148 10*3/uL — ABNORMAL LOW (ref 150–400)
RBC: 4 MIL/uL (ref 3.87–5.11)
RDW: 16.2 % — ABNORMAL HIGH (ref 11.5–15.5)
WBC: 13.4 10*3/uL — ABNORMAL HIGH (ref 4.0–10.5)

## 2016-12-23 LAB — TYPE AND SCREEN
ABO/RH(D): A POS
Antibody Screen: NEGATIVE

## 2016-12-23 LAB — POCT FERN TEST

## 2016-12-23 LAB — ABO/RH: ABO/RH(D): A POS

## 2016-12-23 MED ORDER — PHENYLEPHRINE 40 MCG/ML (10ML) SYRINGE FOR IV PUSH (FOR BLOOD PRESSURE SUPPORT)
80.0000 ug | PREFILLED_SYRINGE | INTRAVENOUS | Status: DC | PRN
  Filled 2016-12-23: qty 5
  Filled 2016-12-23: qty 10

## 2016-12-23 MED ORDER — OXYCODONE-ACETAMINOPHEN 5-325 MG PO TABS: 2.0000 | ORAL_TABLET | ORAL | Status: DC | PRN

## 2016-12-23 MED ORDER — LACTATED RINGERS IV SOLN
INTRAVENOUS | Status: DC
  Administered 2016-12-23 (×2): via INTRAVENOUS

## 2016-12-23 MED ORDER — SODIUM BICARBONATE 8.4 % IV SOLN
INTRAVENOUS | Status: DC | PRN
  Administered 2016-12-23 (×2): 5 mL via EPIDURAL

## 2016-12-23 MED ORDER — LIDOCAINE HCL (PF) 1 % IJ SOLN
INTRAMUSCULAR | Status: DC | PRN
  Administered 2016-12-23: 4 mL via EPIDURAL

## 2016-12-23 MED ORDER — LIDOCAINE HCL (PF) 1 % IJ SOLN
30.0000 mL | INTRAMUSCULAR | Status: DC | PRN
  Filled 2016-12-23: qty 30

## 2016-12-23 MED ORDER — SOD CITRATE-CITRIC ACID 500-334 MG/5ML PO SOLN: 30.0000 mL | ORAL | Status: DC | PRN

## 2016-12-23 MED ORDER — LACTATED RINGERS IV SOLN
500.0000 mL | INTRAVENOUS | Status: DC | PRN
  Administered 2016-12-23: 500 mL via INTRAVENOUS

## 2016-12-23 MED ORDER — EPHEDRINE 5 MG/ML INJ: 10.0000 mg | INTRAVENOUS | Status: DC | PRN

## 2016-12-23 MED ORDER — DIPHENHYDRAMINE HCL 50 MG/ML IJ SOLN: 12.5000 mg | INTRAMUSCULAR | Status: DC | PRN

## 2016-12-23 MED ORDER — OXYTOCIN 40 UNITS IN LACTATED RINGERS INFUSION - SIMPLE MED
2.5000 [IU]/h | INTRAVENOUS | Status: DC
  Filled 2016-12-23: qty 1000

## 2016-12-23 MED ORDER — PHENYLEPHRINE 40 MCG/ML (10ML) SYRINGE FOR IV PUSH (FOR BLOOD PRESSURE SUPPORT): 80.0000 ug | PREFILLED_SYRINGE | INTRAVENOUS | Status: DC | PRN

## 2016-12-23 MED ORDER — BUTORPHANOL TARTRATE 1 MG/ML IJ SOLN
1.0000 mg | INTRAMUSCULAR | Status: DC | PRN
  Administered 2016-12-23: 1 mg via INTRAVENOUS
  Filled 2016-12-23: qty 1

## 2016-12-23 MED ORDER — FENTANYL 2.5 MCG/ML BUPIVACAINE 1/10 % EPIDURAL INFUSION (WH - ANES)
14.0000 mL/h | INTRAMUSCULAR | Status: DC | PRN
  Administered 2016-12-23: 14 mL/h via EPIDURAL
  Filled 2016-12-23: qty 100

## 2016-12-23 MED ORDER — OXYCODONE-ACETAMINOPHEN 5-325 MG PO TABS: 1.0000 | ORAL_TABLET | ORAL | Status: DC | PRN

## 2016-12-23 MED ORDER — PHENYLEPHRINE 40 MCG/ML (10ML) SYRINGE FOR IV PUSH (FOR BLOOD PRESSURE SUPPORT)
80.0000 ug | PREFILLED_SYRINGE | INTRAVENOUS | Status: DC | PRN
  Administered 2016-12-23: 80 ug via INTRAVENOUS
  Filled 2016-12-23: qty 5

## 2016-12-23 MED ORDER — OXYTOCIN BOLUS FROM INFUSION
500.0000 mL | Freq: Once | INTRAVENOUS | Status: AC
  Administered 2016-12-24: 500 mL via INTRAVENOUS

## 2016-12-23 MED ORDER — EPHEDRINE 5 MG/ML INJ
10.0000 mg | INTRAVENOUS | Status: DC | PRN
  Filled 2016-12-23: qty 2

## 2016-12-23 MED ORDER — LACTATED RINGERS IV SOLN: 500.0000 mL | Freq: Once | INTRAVENOUS | Status: DC

## 2016-12-23 MED ORDER — ONDANSETRON HCL 4 MG/2ML IJ SOLN
4.0000 mg | Freq: Four times a day (QID) | INTRAMUSCULAR | Status: DC | PRN
Start: 2016-12-23 — End: 2016-12-24
  Administered 2016-12-23: 4 mg via INTRAVENOUS
  Filled 2016-12-23: qty 2

## 2016-12-23 MED ORDER — LACTATED RINGERS IV SOLN
500.0000 mL | Freq: Once | INTRAVENOUS | Status: AC
  Administered 2016-12-23: 500 mL via INTRAVENOUS

## 2016-12-23 MED ORDER — ACETAMINOPHEN 325 MG PO TABS
650.0000 mg | ORAL_TABLET | ORAL | Status: DC | PRN
  Administered 2016-12-23: 650 mg via ORAL
  Filled 2016-12-23: qty 2

## 2016-12-23 NOTE — MAU Note (Signed)
?   SROM @ 1515 today; c/o ucs all day; c/o bloody show;

## 2016-12-23 NOTE — Anesthesia Pain Management Evaluation Note (Signed)
  CRNA Pain Management Visit Note  Patient: Gabrielle BlackwaterSarah M Foster, 31 y.o., female  "Hello I am a member of the anesthesia team at [facility]. We have an anesthesia team available at all times to provide care throughout the hospital, including epidural management and anesthesia for C-section. I don't know your plan for the delivery whether it a natural birth, water birth, IV sedation, nitrous supplementation, doula or epidural, but we want to meet your pain goals."   1.Was your pain managed to your expectations on prior hospitalizations?   Yes   2.What is your expectation for pain management during this hospitalization?     Epidural  3.How can we help you reach that goal? Pt comfortable with epidural at this time.  Record the patient's initial score and the patient's pain goal.   Pain: 0  Pain Goal: 4 [facility] wants you to be able to say your pain was always managed very well.  [name] 12/23/2016

## 2016-12-23 NOTE — Anesthesia Preprocedure Evaluation (Signed)
Anesthesia Evaluation  Patient identified by MRN, date of birth, ID band Patient awake    Reviewed: Allergy & Precautions, Patient's Chart, lab work & pertinent test results  Airway Mallampati: II       Dental no notable dental hx.    Pulmonary neg pulmonary ROS,    Pulmonary exam normal        Cardiovascular negative cardio ROS Normal cardiovascular exam     Neuro/Psych  Headaches, negative psych ROS   GI/Hepatic negative GI ROS, Neg liver ROS,   Endo/Other  negative endocrine ROS  Renal/GU negative Renal ROS  negative genitourinary   Musculoskeletal negative musculoskeletal ROS (+)   Abdominal   Peds negative pediatric ROS (+)  Hematology negative hematology ROS (+)   Anesthesia Other Findings   Reproductive/Obstetrics (+) Pregnancy                             Lab Results  Component Value Date   WBC 13.4 (H) 12/23/2016   HGB 10.3 (L) 12/23/2016   HCT 32.7 (L) 12/23/2016   MCV 81.8 12/23/2016   PLT 148 (L) 12/23/2016     Anesthesia Physical Anesthesia Plan  ASA: II  Anesthesia Plan: Epidural   Post-op Pain Management:    Induction:   Airway Management Planned:   Additional Equipment:   Intra-op Plan:   Post-operative Plan:   Informed Consent: I have reviewed the patients History and Physical, chart, labs and discussed the procedure including the risks, benefits and alternatives for the proposed anesthesia with the patient or authorized representative who has indicated his/her understanding and acceptance.     Plan Discussed with:   Anesthesia Plan Comments:         Anesthesia Quick Evaluation

## 2016-12-23 NOTE — Telephone Encounter (Signed)
Preadmission screen  

## 2016-12-23 NOTE — H&P (Signed)
Gabrielle BlackwaterSarah M Foster is a 31 y.o. female, G2 P1001, EGA 40+ weeks with EDC 5-11 presenting for ctx.  On eval in MAU VE 4 cm with regular ctx, no evidence of ROM.  Prenatal care essentially uncomplicated.  OB History    Gravida Para Term Preterm AB Living   2 1 1     1    SAB TAB Ectopic Multiple Live Births           1     Past Medical History:  Diagnosis Date  . Common migraine with intractable migraine 04/09/2015  . Headache(784.0)    Migraines  . Migraines    Past Surgical History:  Procedure Laterality Date  . VULVA SURGERY     vaginal septum repair  . WISDOM TOOTH EXTRACTION     Family History: family history includes Heart disease in her mother; Hyperlipidemia in her mother; Hypertension in her father; Kidney disease in her mother; Migraines in her father and sister. Social History:  reports that she has never smoked. She has never used smokeless tobacco. She reports that she does not drink alcohol or use drugs.     Maternal Diabetes: No Genetic Screening: Normal Maternal Ultrasounds/Referrals: Normal Fetal Ultrasounds or other Referrals:  None Maternal Substance Abuse:  No Significant Maternal Medications:  None Significant Maternal Lab Results:  Lab values include: Group B Strep negative Other Comments:  None  Review of Systems  Respiratory: Negative.   Cardiovascular: Negative.    Maternal Medical History:  Reason for admission: Contractions.   Contractions: Frequency: regular.   Perceived severity is strong.    Fetal activity: Perceived fetal activity is normal.    Prenatal complications: no prenatal complications Prenatal Complications - Diabetes: none.   AROM clear Dilation: 8 Effacement (%): 80 Station: 0 Exam by:: Dr.Stephany Poorman Blood pressure 114/73, pulse 86, temperature 97.5 F (36.4 C), temperature source Oral, resp. rate 16, height 5\' 7"  (1.702 m), weight 87.1 kg (192 lb), last menstrual period 01/21/2016, SpO2 100 %. Maternal Exam:   Uterine Assessment: Contraction strength is firm.  Contraction frequency is regular.   Abdomen: Patient reports no abdominal tenderness. Estimated fetal weight is 7 1/2 lbs.   Fetal presentation: vertex  Introitus: Normal vulva. Normal vagina.  Amniotic fluid character: clear.  Pelvis: adequate for delivery.   Cervix: Cervix evaluated by digital exam.     Fetal Exam Fetal Monitor Review: Mode: ultrasound.   Baseline rate: 130.  Variability: moderate (6-25 bpm).   Pattern: accelerations present and no decelerations.    Fetal State Assessment: Category I - tracings are normal.     Physical Exam  Vitals reviewed. Constitutional: She appears well-developed and well-nourished.  Cardiovascular: Normal rate and regular rhythm.   Respiratory: Effort normal. No respiratory distress.  GI: Soft.    Prenatal labs: ABO, Rh: --/--/A POS, A POS (05/15 1753) Antibody: NEG (05/15 1753) Rubella: Immune (10/03 0000) RPR: Nonreactive (10/03 0000)  HBsAg: Negative (10/03 0000)  HIV: Non-reactive (10/03 0000)  GBS: Negative (04/13 0000)   Assessment/Plan: IUP at 40+ weeks in active labor.  Monitor progress, anticipate SVD.   Leighton Roachodd D Serita Degroote 12/23/2016, 10:45 PM

## 2016-12-23 NOTE — Anesthesia Procedure Notes (Signed)
Epidural Patient location during procedure: OB Start time: 12/23/2016 7:34 PM End time: 12/23/2016 7:40 PM  Staffing Anesthesiologist: Shona SimpsonHOLLIS, Magalie Almon D Performed: anesthesiologist   Preanesthetic Checklist Completed: patient identified, site marked, surgical consent, pre-op evaluation, timeout performed, IV checked, risks and benefits discussed and monitors and equipment checked  Epidural Patient position: sitting Prep: ChloraPrep Patient monitoring: heart rate, continuous pulse ox and blood pressure Approach: midline Location: L3-L4 Injection technique: LOR saline  Needle:  Needle type: Tuohy  Needle gauge: 17 G Needle length: 9 cm Catheter type: closed end flexible Catheter size: 20 Guage Test dose: negative and 1.5% lidocaine  Assessment Events: blood not aspirated, injection not painful, no injection resistance and no paresthesia  Additional Notes LOR @ 4.5  Patient identified. Risks/Benefits/Options discussed with patient including but not limited to bleeding, infection, nerve damage, paralysis, failed block, incomplete pain control, headache, blood pressure changes, nausea, vomiting, reactions to medications, itching and postpartum back pain. Confirmed with bedside nurse the patient's most recent platelet count. Confirmed with patient that they are not currently taking any anticoagulation, have any bleeding history or any family history of bleeding disorders. Patient expressed understanding and wished to proceed. All questions were answered. Sterile technique was used throughout the entire procedure. Please see nursing notes for vital signs. Test dose was given through epidural catheter and negative prior to continuing to dose epidural or start infusion. Warning signs of high block given to the patient including shortness of breath, tingling/numbness in hands, complete motor block, or any concerning symptoms with instructions to call for help. Patient was given instructions on  fall risk and not to get out of bed. All questions and concerns addressed with instructions to call with any issues or inadequate analgesia.    Reason for block:procedure for pain

## 2016-12-23 NOTE — MAU Provider Note (Signed)
S: Ms. Gabrielle BlackwaterSarah M Foster is a 31 y.o. G3P1001 at 6738w4d  who presents to MAU today complaining of leaking of fluid since 1600. She states scant vaginal bleeding with mucus discharge. She endorses contractions q 3-4 minutes. She reports normal fetal movement.    O: BP 114/75 (BP Location: Left Arm)   Temp 97.3 F (36.3 C) (Oral)   Resp 18   LMP 01/21/2016 Comment: on birth control GENERAL: Well-developed, well-nourished female in no acute distress.  HEAD: Normocephalic, atraumatic.  CHEST: Normal effort of breathing, regular heart rate ABDOMEN: Soft, nontender, gravid PELVIC: Normal external female genitalia. Vagina is pink and rugated. Cervix with normal contour, no lesions. Mucus discharge.  Negative pooling.   Cervical exam:  Dilation: 4 Effacement (%): 70 Cervical Position: Posterior Station: -2 Exam by:: Morrison Oldee Carter RN   Fetal Monitoring: Baseline: 140 bpm Variability: moderate Accelerations: 15 x 15 Decelerations: none Contractions: q 3-4 minutes  Results for orders placed or performed during the hospital encounter of 12/23/16 (from the past 24 hour(s))  Fern Test     Status: Normal (Preliminary result)   Collection Time: 12/23/16  4:59 PM  Result Value Ref Range   POCT Fern Test     Negative   A: SIUP at 5638w4d  Membranes intact  P: Report given to RN to contact MD on call for further instructions  Marny LowensteinWenzel, Julie N, PA-C 12/23/2016 5:10 PM

## 2016-12-24 LAB — CBC
HCT: 31 % — ABNORMAL LOW (ref 36.0–46.0)
Hemoglobin: 9.7 g/dL — ABNORMAL LOW (ref 12.0–15.0)
MCH: 25.7 pg — ABNORMAL LOW (ref 26.0–34.0)
MCHC: 31.3 g/dL (ref 30.0–36.0)
MCV: 82 fL (ref 78.0–100.0)
Platelets: 134 10*3/uL — ABNORMAL LOW (ref 150–400)
RBC: 3.78 MIL/uL — ABNORMAL LOW (ref 3.87–5.11)
RDW: 16.4 % — ABNORMAL HIGH (ref 11.5–15.5)
WBC: 17 10*3/uL — ABNORMAL HIGH (ref 4.0–10.5)

## 2016-12-24 LAB — RPR: RPR Ser Ql: NONREACTIVE

## 2016-12-24 MED ORDER — TETANUS-DIPHTH-ACELL PERTUSSIS 5-2.5-18.5 LF-MCG/0.5 IM SUSP: 0.5000 mL | Freq: Once | INTRAMUSCULAR | Status: DC

## 2016-12-24 MED ORDER — METHYLERGONOVINE MALEATE 0.2 MG PO TABS: 0.2000 mg | ORAL_TABLET | ORAL | Status: DC | PRN

## 2016-12-24 MED ORDER — ACETAMINOPHEN 325 MG PO TABS
650.0000 mg | ORAL_TABLET | ORAL | Status: DC | PRN
  Administered 2016-12-24 – 2016-12-25 (×3): 650 mg via ORAL
  Filled 2016-12-24 (×3): qty 2

## 2016-12-24 MED ORDER — OXYCODONE HCL 5 MG PO TABS: 5.0000 mg | ORAL_TABLET | ORAL | Status: DC | PRN

## 2016-12-24 MED ORDER — IBUPROFEN 600 MG PO TABS
600.0000 mg | ORAL_TABLET | Freq: Four times a day (QID) | ORAL | Status: DC
  Administered 2016-12-24 – 2016-12-25 (×5): 600 mg via ORAL
  Filled 2016-12-24 (×5): qty 1

## 2016-12-24 MED ORDER — ONDANSETRON HCL 4 MG PO TABS: 4.0000 mg | ORAL_TABLET | ORAL | Status: DC | PRN

## 2016-12-24 MED ORDER — BENZOCAINE-MENTHOL 20-0.5 % EX AERO
1.0000 "application " | INHALATION_SPRAY | CUTANEOUS | Status: DC | PRN
  Filled 2016-12-24: qty 56

## 2016-12-24 MED ORDER — METHYLERGONOVINE MALEATE 0.2 MG/ML IJ SOLN: 0.2000 mg | INTRAMUSCULAR | Status: DC | PRN

## 2016-12-24 MED ORDER — ZOLPIDEM TARTRATE 5 MG PO TABS: 5.0000 mg | ORAL_TABLET | Freq: Every evening | ORAL | Status: DC | PRN

## 2016-12-24 MED ORDER — SENNOSIDES-DOCUSATE SODIUM 8.6-50 MG PO TABS
2.0000 | ORAL_TABLET | ORAL | Status: DC
  Administered 2016-12-24: 2 via ORAL
  Filled 2016-12-24: qty 2

## 2016-12-24 MED ORDER — DIPHENHYDRAMINE HCL 25 MG PO CAPS
25.0000 mg | ORAL_CAPSULE | Freq: Four times a day (QID) | ORAL | Status: DC | PRN
Start: 2016-12-24 — End: 2016-12-25

## 2016-12-24 MED ORDER — COCONUT OIL OIL
1.0000 "application " | TOPICAL_OIL | Status: DC | PRN
  Administered 2016-12-25: 1 via TOPICAL
  Filled 2016-12-24: qty 120

## 2016-12-24 MED ORDER — PRENATAL MULTIVITAMIN CH
1.0000 | ORAL_TABLET | Freq: Every day | ORAL | Status: DC
  Administered 2016-12-24: 1 via ORAL
  Filled 2016-12-24: qty 1

## 2016-12-24 MED ORDER — MAGNESIUM HYDROXIDE 400 MG/5ML PO SUSP: 30.0000 mL | ORAL | Status: DC | PRN

## 2016-12-24 MED ORDER — MEASLES, MUMPS & RUBELLA VAC ~~LOC~~ INJ
0.5000 mL | INJECTION | Freq: Once | SUBCUTANEOUS | Status: DC
  Filled 2016-12-24: qty 0.5

## 2016-12-24 MED ORDER — ONDANSETRON HCL 4 MG/2ML IJ SOLN: 4.0000 mg | INTRAMUSCULAR | Status: DC | PRN

## 2016-12-24 MED ORDER — DIBUCAINE 1 % RE OINT
1.0000 "application " | TOPICAL_OINTMENT | RECTAL | Status: DC | PRN
  Administered 2016-12-24: 1 via RECTAL
  Filled 2016-12-24: qty 28

## 2016-12-24 MED ORDER — SIMETHICONE 80 MG PO CHEW: 80.0000 mg | CHEWABLE_TABLET | ORAL | Status: DC | PRN

## 2016-12-24 MED ORDER — WITCH HAZEL-GLYCERIN EX PADS
1.0000 "application " | MEDICATED_PAD | CUTANEOUS | Status: DC | PRN
  Administered 2016-12-24: 1 via TOPICAL

## 2016-12-24 MED ORDER — OXYCODONE HCL 5 MG PO TABS: 10.0000 mg | ORAL_TABLET | ORAL | Status: DC | PRN

## 2016-12-24 NOTE — Progress Notes (Signed)
Patient ID: Gabrielle BlackwaterSarah M Gonzalez-Graham, female   DOB: 14-Jun-1986, 31 y.o.   MRN: [id number] PPD #0 Pt doing well. Pain controlled with ibuprofen. Lochia mild. No CP, SOB or HA. Bonding well with baby - breastfeeding VSS ABD - FF EXT - mild edema, no pitting, no Homans  17>9.7<134  A/P: PPD#0 s/p svd - stable        Routine pp care        D/C IV

## 2016-12-24 NOTE — Lactation Note (Signed)
This note was copied from a baby's chart. Lactation Consultation Note  Patient Name: Gabrielle Etta QuillSarah Gonzalez-Graham ZOXWR'UToday's Date: 12/24/2016 Reason for consult: Follow-up assessment  Infant is 7921 hours old. She has not yet had a void, but she has had 8 stools. Mom would like someone from lactation to observe a latch. Mom thinks the latch is good, but she is wanting to be sure that she does not have the same experience as she did with her 1st baby (scabbing, thrush, needing to use a nipple shield b/c of nipple trauma, etc.).   Mom reports only tenderness with initial latch & very mild redness. Infant is currently sleeping in bassinet. Mom has the # to call for lactation for latch check when she is ready.  Lurline HareRichey, Leighanne Adolph [facility] 12/24/2016, 10:13 PM

## 2016-12-24 NOTE — Lactation Note (Addendum)
This note was copied from a baby's chart. Lactation Consultation Note  Baby 3 hrs old. Mom BF her 713 yr old for 3 months. States had no difficulty BF.  Mom has tubular breast w/nipple at the bottom end of breast to a point. Nipple w/short shaft, very compressible. Mom holding baby in cradle position trying to BF w/fingers in scissor hold. Noted baby has nasal congestion. Mom stated baby BF great in L&D but hasn't been interested since, just a few short BF. Discussed newborn feeding habits, I&O, STS, cluster feeding.  Mom encouraged to feed baby 8-12 times/24 hours and with feeding cues. Encouraged to call for assistance or questions.  [facility]/LC brochure given w/resources, support groups and LC services. Patient Name: Gabrielle Etta QuillSarah Gonzalez-Graham AOZHY'QToday's Date: 12/24/2016 Reason for consult: Initial assessment   Maternal Data Has patient been taught Hand Expression?: Yes Does the patient have breastfeeding experience prior to this delivery?: Yes  Feeding Feeding Type: Breast Fed Length of feed: 5 min  LATCH Score/Interventions Latch: Repeated attempts needed to sustain latch, nipple held in mouth throughout feeding, stimulation needed to elicit sucking reflex. Intervention(s): Adjust position;Assist with latch;Breast massage  Audible Swallowing: None Intervention(s): Skin to skin;Hand expression  Type of Nipple: Everted at rest and after stimulation Intervention(s): No intervention needed  Comfort (Breast/Nipple): Soft / non-tender     Hold (Positioning): No assistance needed to correctly position infant at breast. Intervention(s): Breastfeeding basics reviewed;Support Pillows;Position options;Skin to skin  LATCH Score: 7  Lactation Tools Discussed/Used     Consult Status Consult Status: Follow-up Date: 12/24/16 Follow-up type: In-patient    Charyl DancerCARVER, Eziah Negro G 12/24/2016, 4:24 AM

## 2016-12-24 NOTE — Anesthesia Postprocedure Evaluation (Signed)
Anesthesia Post Note  Patient: Gabrielle BlackwaterSarah M Gonzalez-Graham  Procedure(s) Performed: * No procedures listed *  Patient location during evaluation: Mother Baby Anesthesia Type: Epidural Level of consciousness: awake and alert and oriented Pain management: satisfactory to patient Vital Signs Assessment: post-procedure vital signs reviewed and stable Respiratory status: spontaneous breathing and nonlabored ventilation Cardiovascular status: stable Postop Assessment: no headache, no backache, no signs of nausea or vomiting, adequate PO intake and patient able to bend at knees (patient up walking) Anesthetic complications: no        Last Vitals:  Vitals:   12/24/16 0430 12/24/16 0900  BP: 105/65 (!) 107/58  Pulse: 78 84  Resp: 18 19  Temp: 36.7 C 36.5 C    Last Pain:  Vitals:   12/24/16 0900  TempSrc: Oral  PainSc:    Pain Goal: Patients Stated Pain Goal: 4 (12/23/16 1800)               Madison HickmanGREGORY,Tico Crotteau

## 2016-12-25 MED ORDER — IBUPROFEN 800 MG PO TABS
800.0000 mg | ORAL_TABLET | Freq: Three times a day (TID) | ORAL | 1 refills | Status: DC | PRN
Start: 2016-12-25 — End: 2020-09-17

## 2016-12-25 MED ORDER — PRENATAL MULTIVITAMIN CH: 1.0000 | ORAL_TABLET | Freq: Every day | ORAL | 3 refills | Status: DC

## 2016-12-25 NOTE — Lactation Note (Signed)
This note was copied from a baby's chart. Lactation Consultation Note Mom has red irritation to nipples, tender. Coconut oil given. Skin intact. Mom c/o painful latch. Latch was shallow, BF in cross cradle and cradle position. Encouraged to try football to get a deep latch at this time. Discussed pillows, for positioning, props, body alignment, getting baby to open wide, hand positioning when latching, "C" hold, bringing baby to mom, and comfort during BF.  Mom stated latch felt much better. Had brief pain when first latched but was comfortable after that.  Shells given to wear in bra. Mom stated she had worn NS with her 1st child.  Mom has pendulum or "V" shaped breast, nipples at the bottle end on breast. Nipples do come to a point, but are flat when breast held up for latching. Breast very compressibile for latching as well. Mom hand expressed colostrum.  Hand pump given to pre-pump nipple to evert more before latching.  Mom felt consult helpful. Patient Name: Gabrielle Etta QuillSarah Gonzalez-Graham ZOXWR'UToday's Date: 12/25/2016 Reason for consult: Follow-up assessment;Breast/nipple pain   Maternal Data    Feeding Feeding Type: Breast Fed Length of feed: 20 min  LATCH Score/Interventions Latch: Repeated attempts needed to sustain latch, nipple held in mouth throughout feeding, stimulation needed to elicit sucking reflex. Intervention(s): Adjust position;Assist with latch;Breast massage;Breast compression  Audible Swallowing: A few with stimulation Intervention(s): Skin to skin;Hand expression Intervention(s): Alternate breast massage  Type of Nipple: Flat Intervention(s): Hand pump;Shells  Comfort (Breast/Nipple): Filling, red/small blisters or bruises, mild/mod discomfort  Problem noted: Mild/Moderate discomfort Interventions (Mild/moderate discomfort): Hand massage;Hand expression  Hold (Positioning): Assistance needed to correctly position infant at breast and maintain latch. Intervention(s):  Breastfeeding basics reviewed;Support Pillows;Position options;Skin to skin  LATCH Score: 5  Lactation Tools Discussed/Used Tools: Shells;Pump (coconut oil) Shell Type: Inverted Breast pump type: Manual Pump Review: Setup, frequency, and cleaning;Milk Storage Initiated by:: Peri JeffersonL. Lacrecia Delval RN IBCLC Date initiated:: 12/25/16   Consult Status Consult Status: Follow-up Date: 12/25/16 (in pm) Follow-up type: In-patient    Charyl DancerCARVER, Uriel Horkey G 12/25/2016, 2:34 AM

## 2016-12-25 NOTE — Lactation Note (Signed)
This note was copied from a baby's chart. Lactation Consultation Note  Patient Name: [name] ZOXWR'UToday's Date: 12/25/2016 Reason for consult: Follow-up assessment   Follow up with mom of 33 hour old infant. Mom reports no questions/concerns at this time.   I/O, Engorgement prevention/treatment, milk coming to volume and breast milk handling and storage reviewed. [facility], mom aware of OP services, BF Support Groups and LC phone #.    Maternal Data Has patient been taught Hand Expression?: Yes Does the patient have breastfeeding experience prior to this delivery?: Yes  Feeding Feeding Type: Breast Fed Length of feed: 5 min  LATCH Score/Interventions                      Lactation Tools Discussed/Used     Consult Status Consult Status: Complete Follow-up type: Call as needed    Ed BlalockSharon S Jamariya Davidoff 12/25/2016, 10:32 AM

## 2016-12-25 NOTE — Progress Notes (Addendum)
Post Partum Day 1 Subjective: no complaints, up ad lib, voiding, tolerating PO and nl lochia, pain controlled  Objective: Blood pressure 92/60, pulse (!) 59, temperature 97.7 F (36.5 C), temperature source Oral, resp. rate 18, height 5\' 7"  (1.702 m), weight 87.1 kg (192 lb), last menstrual period 01/21/2016, SpO2 97 %.  Physical Exam:  General: alert and no distress Lochia: appropriate Uterine Fundus: firm   Recent Labs  12/23/16 1753 12/24/16 0253  HGB 10.3* 9.7*  HCT 32.7* 31.0*    Assessment/Plan: Breastfeeding and Lactation consult.  Routine PP care.  Pt desires d/c to home.  Will d/c with motrin and PNV.  F/u 6 weeks   LOS: 2 days   Gabrielle Foster 12/25/2016, 8:56 AM

## 2016-12-25 NOTE — Discharge Summary (Signed)
OB Discharge Summary     Patient Name: Gabrielle BlackwaterSarah M Foster DOB: June 17, 1986 MRN: [id number]  Date of admission: 12/23/2016 Delivering MD: Jackelyn KnifeMEISINGER, TODD   Date of discharge: 12/25/2016  Admitting diagnosis: 40WKS WATER BROKE CONTRACTION 3-4 MINS Intrauterine pregnancy: 567w6d     Secondary diagnosis:  Active Problems:   Indication for care in labor or delivery   SVD (spontaneous vaginal delivery)  Additional problems: N/A     Discharge diagnosis: Term Pregnancy Delivered                                                                                                Post partum procedures:N/A  Augmentation: N/A  Complications: None  Hospital course:  Onset of Labor With Vaginal Delivery     31 y.o. yo G2P1001 at 757w6d was admitted in Latent Labor on 12/23/2016. Patient had an uncomplicated labor course as follows:  Membrane Rupture Time/Date: 10:43 PM ,12/23/2016   Intrapartum Procedures: Episiotomy: None [1]                                         Lacerations:  2nd degree [3]  Patient had a delivery of a Viable infant. 12/24/2016  Information for the patient's newborn:  Meyer CoryGonzalez-Graham, Girl Dema [[id number]][[id number]]  Delivery Method: Vaginal, Spontaneous Delivery (Filed from Delivery Summary)    Pateint had an uncomplicated postpartum course.  She is ambulating, tolerating a regular diet, passing flatus, and urinating well. Patient is discharged home in stable condition on 12/25/16.   Physical exam  Vitals:   12/24/16 0430 12/24/16 0900 12/24/16 1817 12/25/16 0532  BP: 105/65 (!) 107/58 97/71 92/60   Pulse: 78 84 75 (!) 59  Resp: 18 19 18 18   Temp: 98.1 F (36.7 C) 97.7 F (36.5 C) 98.8 F (37.1 C) 97.7 F (36.5 C)  TempSrc: Oral Oral  Oral  SpO2: 100% 97%    Weight:      Height:       General: alert and no distress Lochia: appropriate Uterine Fundus: firm  Labs: Lab Results  Component Value Date   WBC 17.0 (H) 12/24/2016   HGB 9.7 (L) 12/24/2016   HCT 31.0  (L) 12/24/2016   MCV 82.0 12/24/2016   PLT 134 (L) 12/24/2016   CMP Latest Ref Rng & Units 02/20/2016  Glucose 65 - 99 mg/dL 84  BUN 6 - 20 mg/dL 8  Creatinine 7.820.44 - 9.561.00 mg/dL 2.130.69  Sodium 086135 - 578145 mmol/L 136  Potassium 3.5 - 5.1 mmol/L 3.6  Chloride 101 - 111 mmol/L 108  CO2 22 - 32 mmol/L 21(L)  Calcium 8.9 - 10.3 mg/dL 8.9  Total Protein 6.5 - 8.1 g/dL 7.6  Total Bilirubin 0.3 - 1.2 mg/dL 0.7  Alkaline Phos 38 - 126 U/L 37(L)  AST 15 - 41 U/L 17  ALT 14 - 54 U/L 12(L)    Discharge instruction: per After Visit Summary and "Baby and Me Booklet".  After visit meds:  Allergies as of 12/25/2016   No Known  Allergies     Medication List    TAKE these medications   acetaminophen 500 MG tablet Commonly known as:  TYLENOL Take 1,000 mg by mouth every 6 (six) hours as needed for mild pain or headache.   ibuprofen 800 MG tablet Commonly known as:  ADVIL,MOTRIN Take 1 tablet (800 mg total) by mouth every 8 (eight) hours as needed.   phenylephrine-shark liver oil-mineral oil-petrolatum 0.25-3-14-71.9 % rectal ointment Commonly known as:  PREPARATION H Place 1 application rectally 2 (two) times daily as needed for hemorrhoids.   prenatal multivitamin Tabs tablet Take 1 tablet by mouth daily at 12 noon.   witch hazel-glycerin pad Commonly known as:  TUCKS Apply 1 application topically as needed for itching, irritation or hemorrhoids.       Diet: routine diet  Activity: Advance as tolerated. Pelvic rest for 6 weeks.   Outpatient follow up:6 weeks Follow up Appt:No future appointments. Follow up Visit:No Follow-up on file.  Postpartum contraception: Undecided  Newborn Data: Live born female  Birth Weight: 9 lb 4.9 oz (4221 g) APGAR: 9, 9  Baby Feeding: Breast Disposition:home with mother   12/25/2016 Sherian Rein, MD

## 2016-12-26 ENCOUNTER — Inpatient Hospital Stay (HOSPITAL_COMMUNITY): Admission: RE | Admit: 2016-12-26 | Payer: PRIVATE HEALTH INSURANCE | Source: Ambulatory Visit

## 2017-06-15 ENCOUNTER — Encounter (HOSPITAL_COMMUNITY): Payer: Self-pay

## 2018-11-15 DIAGNOSIS — F418 Other specified anxiety disorders: Secondary | ICD-10-CM | POA: Diagnosis not present

## 2019-05-09 ENCOUNTER — Other Ambulatory Visit: Payer: Self-pay | Admitting: *Deleted

## 2019-05-09 DIAGNOSIS — Z20822 Contact with and (suspected) exposure to covid-19: Secondary | ICD-10-CM

## 2019-05-10 LAB — NOVEL CORONAVIRUS, NAA: SARS-CoV-2, NAA: NOT DETECTED

## 2019-05-18 ENCOUNTER — Other Ambulatory Visit: Payer: Self-pay

## 2019-05-18 DIAGNOSIS — Z20822 Contact with and (suspected) exposure to covid-19: Secondary | ICD-10-CM

## 2019-05-19 LAB — NOVEL CORONAVIRUS, NAA: SARS-CoV-2, NAA: NOT DETECTED

## 2019-05-24 ENCOUNTER — Other Ambulatory Visit: Payer: Self-pay

## 2019-05-24 DIAGNOSIS — Z20822 Contact with and (suspected) exposure to covid-19: Secondary | ICD-10-CM

## 2019-05-24 DIAGNOSIS — Z20828 Contact with and (suspected) exposure to other viral communicable diseases: Secondary | ICD-10-CM | POA: Diagnosis not present

## 2019-05-25 LAB — NOVEL CORONAVIRUS, NAA: SARS-CoV-2, NAA: NOT DETECTED

## 2019-05-30 ENCOUNTER — Other Ambulatory Visit: Payer: Self-pay

## 2019-05-30 DIAGNOSIS — Z20822 Contact with and (suspected) exposure to covid-19: Secondary | ICD-10-CM

## 2019-05-31 LAB — NOVEL CORONAVIRUS, NAA: SARS-CoV-2, NAA: NOT DETECTED

## 2019-06-03 DIAGNOSIS — F418 Other specified anxiety disorders: Secondary | ICD-10-CM | POA: Diagnosis not present

## 2019-06-03 DIAGNOSIS — E559 Vitamin D deficiency, unspecified: Secondary | ICD-10-CM | POA: Diagnosis not present

## 2019-06-03 DIAGNOSIS — G43909 Migraine, unspecified, not intractable, without status migrainosus: Secondary | ICD-10-CM | POA: Diagnosis not present

## 2019-06-03 DIAGNOSIS — Z79899 Other long term (current) drug therapy: Secondary | ICD-10-CM | POA: Diagnosis not present

## 2019-06-03 DIAGNOSIS — R5383 Other fatigue: Secondary | ICD-10-CM | POA: Diagnosis not present

## 2019-06-07 ENCOUNTER — Other Ambulatory Visit: Payer: Self-pay

## 2019-06-07 DIAGNOSIS — Z20822 Contact with and (suspected) exposure to covid-19: Secondary | ICD-10-CM

## 2019-06-09 DIAGNOSIS — D225 Melanocytic nevi of trunk: Secondary | ICD-10-CM | POA: Diagnosis not present

## 2019-06-09 DIAGNOSIS — Z872 Personal history of diseases of the skin and subcutaneous tissue: Secondary | ICD-10-CM | POA: Diagnosis not present

## 2019-06-09 DIAGNOSIS — L910 Hypertrophic scar: Secondary | ICD-10-CM | POA: Diagnosis not present

## 2019-06-09 DIAGNOSIS — D485 Neoplasm of uncertain behavior of skin: Secondary | ICD-10-CM | POA: Diagnosis not present

## 2019-06-09 DIAGNOSIS — D1801 Hemangioma of skin and subcutaneous tissue: Secondary | ICD-10-CM | POA: Diagnosis not present

## 2019-06-09 DIAGNOSIS — R208 Other disturbances of skin sensation: Secondary | ICD-10-CM | POA: Diagnosis not present

## 2019-06-09 DIAGNOSIS — D2271 Melanocytic nevi of right lower limb, including hip: Secondary | ICD-10-CM | POA: Diagnosis not present

## 2019-06-09 DIAGNOSIS — D2272 Melanocytic nevi of left lower limb, including hip: Secondary | ICD-10-CM | POA: Diagnosis not present

## 2019-06-09 LAB — NOVEL CORONAVIRUS, NAA: SARS-CoV-2, NAA: NOT DETECTED

## 2019-06-13 ENCOUNTER — Other Ambulatory Visit: Payer: Self-pay

## 2019-06-13 DIAGNOSIS — Z20822 Contact with and (suspected) exposure to covid-19: Secondary | ICD-10-CM

## 2019-06-15 LAB — NOVEL CORONAVIRUS, NAA: SARS-CoV-2, NAA: NOT DETECTED

## 2019-06-20 ENCOUNTER — Other Ambulatory Visit: Payer: Self-pay

## 2019-06-20 DIAGNOSIS — Z20822 Contact with and (suspected) exposure to covid-19: Secondary | ICD-10-CM

## 2019-06-22 LAB — NOVEL CORONAVIRUS, NAA: SARS-CoV-2, NAA: NOT DETECTED

## 2019-06-28 ENCOUNTER — Other Ambulatory Visit: Payer: Self-pay

## 2019-06-28 DIAGNOSIS — Z20822 Contact with and (suspected) exposure to covid-19: Secondary | ICD-10-CM

## 2019-06-29 LAB — NOVEL CORONAVIRUS, NAA: SARS-CoV-2, NAA: NOT DETECTED

## 2019-07-04 ENCOUNTER — Other Ambulatory Visit: Payer: Self-pay

## 2019-07-04 DIAGNOSIS — Z20822 Contact with and (suspected) exposure to covid-19: Secondary | ICD-10-CM

## 2019-07-06 LAB — NOVEL CORONAVIRUS, NAA: SARS-CoV-2, NAA: NOT DETECTED

## 2019-07-11 ENCOUNTER — Other Ambulatory Visit: Payer: Self-pay

## 2019-07-11 DIAGNOSIS — Z20822 Contact with and (suspected) exposure to covid-19: Secondary | ICD-10-CM

## 2019-07-12 LAB — NOVEL CORONAVIRUS, NAA: SARS-CoV-2, NAA: NOT DETECTED

## 2019-07-18 ENCOUNTER — Other Ambulatory Visit: Payer: Self-pay

## 2019-07-18 DIAGNOSIS — Z20822 Contact with and (suspected) exposure to covid-19: Secondary | ICD-10-CM

## 2019-07-20 LAB — NOVEL CORONAVIRUS, NAA: SARS-CoV-2, NAA: NOT DETECTED

## 2019-07-25 DIAGNOSIS — Z20828 Contact with and (suspected) exposure to other viral communicable diseases: Secondary | ICD-10-CM | POA: Diagnosis not present

## 2019-07-28 DIAGNOSIS — H109 Unspecified conjunctivitis: Secondary | ICD-10-CM | POA: Diagnosis not present

## 2019-07-28 DIAGNOSIS — F418 Other specified anxiety disorders: Secondary | ICD-10-CM | POA: Diagnosis not present

## 2019-08-01 ENCOUNTER — Ambulatory Visit: Payer: PRIVATE HEALTH INSURANCE | Attending: Internal Medicine

## 2019-08-01 DIAGNOSIS — Z20822 Contact with and (suspected) exposure to covid-19: Secondary | ICD-10-CM

## 2019-08-02 LAB — NOVEL CORONAVIRUS, NAA: SARS-CoV-2, NAA: NOT DETECTED

## 2019-08-08 ENCOUNTER — Ambulatory Visit: Payer: PRIVATE HEALTH INSURANCE | Attending: Internal Medicine

## 2019-08-08 DIAGNOSIS — Z20822 Contact with and (suspected) exposure to covid-19: Secondary | ICD-10-CM

## 2019-08-08 DIAGNOSIS — Z20828 Contact with and (suspected) exposure to other viral communicable diseases: Secondary | ICD-10-CM | POA: Diagnosis not present

## 2019-08-10 LAB — NOVEL CORONAVIRUS, NAA: SARS-CoV-2, NAA: NOT DETECTED

## 2019-08-15 ENCOUNTER — Ambulatory Visit: Payer: PRIVATE HEALTH INSURANCE | Attending: Internal Medicine

## 2019-08-15 DIAGNOSIS — Z20822 Contact with and (suspected) exposure to covid-19: Secondary | ICD-10-CM | POA: Diagnosis not present

## 2019-08-16 LAB — NOVEL CORONAVIRUS, NAA: SARS-CoV-2, NAA: NOT DETECTED

## 2019-08-22 ENCOUNTER — Other Ambulatory Visit: Payer: PRIVATE HEALTH INSURANCE

## 2019-08-22 ENCOUNTER — Ambulatory Visit: Payer: PRIVATE HEALTH INSURANCE | Attending: Internal Medicine

## 2019-08-23 DIAGNOSIS — E669 Obesity, unspecified: Secondary | ICD-10-CM | POA: Diagnosis not present

## 2019-08-23 DIAGNOSIS — Z124 Encounter for screening for malignant neoplasm of cervix: Secondary | ICD-10-CM | POA: Diagnosis not present

## 2019-08-23 DIAGNOSIS — Z130 Encounter for screening for diseases of the blood and blood-forming organs and certain disorders involving the immune mechanism: Secondary | ICD-10-CM | POA: Diagnosis not present

## 2019-08-23 DIAGNOSIS — Z01419 Encounter for gynecological examination (general) (routine) without abnormal findings: Secondary | ICD-10-CM | POA: Diagnosis not present

## 2019-08-24 DIAGNOSIS — Z124 Encounter for screening for malignant neoplasm of cervix: Secondary | ICD-10-CM | POA: Diagnosis not present

## 2019-08-30 ENCOUNTER — Ambulatory Visit: Payer: PRIVATE HEALTH INSURANCE | Attending: Internal Medicine

## 2019-08-30 DIAGNOSIS — Z20822 Contact with and (suspected) exposure to covid-19: Secondary | ICD-10-CM | POA: Diagnosis not present

## 2019-08-31 LAB — NOVEL CORONAVIRUS, NAA: SARS-CoV-2, NAA: NOT DETECTED

## 2019-09-06 ENCOUNTER — Ambulatory Visit: Payer: PRIVATE HEALTH INSURANCE | Attending: Internal Medicine

## 2019-09-06 DIAGNOSIS — Z20822 Contact with and (suspected) exposure to covid-19: Secondary | ICD-10-CM

## 2019-09-07 LAB — NOVEL CORONAVIRUS, NAA: SARS-CoV-2, NAA: NOT DETECTED

## 2019-09-12 DIAGNOSIS — N879 Dysplasia of cervix uteri, unspecified: Secondary | ICD-10-CM | POA: Diagnosis not present

## 2019-09-12 DIAGNOSIS — R87619 Unspecified abnormal cytological findings in specimens from cervix uteri: Secondary | ICD-10-CM | POA: Diagnosis not present

## 2019-09-14 ENCOUNTER — Ambulatory Visit: Payer: PRIVATE HEALTH INSURANCE | Attending: Internal Medicine

## 2019-09-14 DIAGNOSIS — Z20822 Contact with and (suspected) exposure to covid-19: Secondary | ICD-10-CM

## 2019-09-15 LAB — NOVEL CORONAVIRUS, NAA: SARS-CoV-2, NAA: NOT DETECTED

## 2019-09-19 ENCOUNTER — Ambulatory Visit: Payer: PRIVATE HEALTH INSURANCE | Attending: Internal Medicine

## 2019-09-19 DIAGNOSIS — Z20822 Contact with and (suspected) exposure to covid-19: Secondary | ICD-10-CM

## 2019-09-20 LAB — NOVEL CORONAVIRUS, NAA: SARS-CoV-2, NAA: NOT DETECTED

## 2019-10-05 ENCOUNTER — Ambulatory Visit: Payer: PRIVATE HEALTH INSURANCE | Attending: Internal Medicine

## 2019-10-05 DIAGNOSIS — Z20822 Contact with and (suspected) exposure to covid-19: Secondary | ICD-10-CM

## 2019-10-06 LAB — NOVEL CORONAVIRUS, NAA: SARS-CoV-2, NAA: NOT DETECTED

## 2019-10-19 DIAGNOSIS — K130 Diseases of lips: Secondary | ICD-10-CM | POA: Diagnosis not present

## 2019-10-31 DIAGNOSIS — Z20828 Contact with and (suspected) exposure to other viral communicable diseases: Secondary | ICD-10-CM | POA: Diagnosis not present

## 2019-10-31 DIAGNOSIS — U071 COVID-19: Secondary | ICD-10-CM | POA: Diagnosis not present

## 2019-11-01 ENCOUNTER — Other Ambulatory Visit: Payer: Self-pay

## 2019-11-01 ENCOUNTER — Ambulatory Visit: Payer: PRIVATE HEALTH INSURANCE | Attending: Internal Medicine

## 2019-11-01 DIAGNOSIS — Z20822 Contact with and (suspected) exposure to covid-19: Secondary | ICD-10-CM

## 2019-11-02 LAB — NOVEL CORONAVIRUS, NAA: SARS-CoV-2, NAA: NOT DETECTED

## 2019-11-02 LAB — SARS-COV-2, NAA 2 DAY TAT

## 2019-11-14 ENCOUNTER — Ambulatory Visit: Payer: PRIVATE HEALTH INSURANCE | Attending: Internal Medicine

## 2019-11-14 DIAGNOSIS — Z20822 Contact with and (suspected) exposure to covid-19: Secondary | ICD-10-CM

## 2019-11-15 LAB — NOVEL CORONAVIRUS, NAA: SARS-CoV-2, NAA: NOT DETECTED

## 2019-11-15 LAB — SARS-COV-2, NAA 2 DAY TAT

## 2019-11-28 ENCOUNTER — Other Ambulatory Visit: Payer: PRIVATE HEALTH INSURANCE

## 2019-11-30 ENCOUNTER — Ambulatory Visit: Payer: PRIVATE HEALTH INSURANCE | Attending: Internal Medicine

## 2019-11-30 DIAGNOSIS — Z20822 Contact with and (suspected) exposure to covid-19: Secondary | ICD-10-CM

## 2019-12-01 LAB — NOVEL CORONAVIRUS, NAA: SARS-CoV-2, NAA: NOT DETECTED

## 2019-12-01 LAB — SARS-COV-2, NAA 2 DAY TAT

## 2020-01-21 ENCOUNTER — Other Ambulatory Visit: Payer: Self-pay

## 2020-01-21 ENCOUNTER — Emergency Department (HOSPITAL_COMMUNITY)
Admission: EM | Admit: 2020-01-21 | Discharge: 2020-01-21 | Disposition: A | Discharge status: Home / Self Care | Payer: BC Managed Care – PPO | Attending: Emergency Medicine | Admitting: Emergency Medicine

## 2020-01-21 DIAGNOSIS — S81012A Laceration without foreign body, left knee, initial encounter: Secondary | ICD-10-CM | POA: Insufficient documentation

## 2020-01-21 DIAGNOSIS — Y9301 Activity, walking, marching and hiking: Secondary | ICD-10-CM | POA: Diagnosis not present

## 2020-01-21 DIAGNOSIS — S80911A Unspecified superficial injury of right knee, initial encounter: Secondary | ICD-10-CM | POA: Diagnosis not present

## 2020-01-21 DIAGNOSIS — Y92049 Unspecified place in boarding-house as the place of occurrence of the external cause: Secondary | ICD-10-CM | POA: Insufficient documentation

## 2020-01-21 DIAGNOSIS — Y999 Unspecified external cause status: Secondary | ICD-10-CM | POA: Diagnosis not present

## 2020-01-21 DIAGNOSIS — Z23 Encounter for immunization: Secondary | ICD-10-CM | POA: Diagnosis not present

## 2020-01-21 DIAGNOSIS — W0110XA Fall on same level from slipping, tripping and stumbling with subsequent striking against unspecified object, initial encounter: Secondary | ICD-10-CM | POA: Insufficient documentation

## 2020-01-21 MED ORDER — TETANUS-DIPHTH-ACELL PERTUSSIS 5-2.5-18.5 LF-MCG/0.5 IM SUSP
0.5000 mL | Freq: Once | INTRAMUSCULAR | Status: AC
  Administered 2020-01-21: 0.5 mL via INTRAMUSCULAR
  Filled 2020-01-21: qty 0.5

## 2020-01-21 MED ORDER — LIDOCAINE-EPINEPHRINE (PF) 2 %-1:200000 IJ SOLN
INTRAMUSCULAR | Status: AC
  Filled 2020-01-21: qty 20

## 2020-01-21 MED ORDER — BACITRACIN ZINC 500 UNIT/GM EX OINT
TOPICAL_OINTMENT | Freq: Two times a day (BID) | CUTANEOUS | Status: DC
  Administered 2020-01-21: 1 via TOPICAL
  Filled 2020-01-21: qty 0.9

## 2020-01-21 NOTE — Discharge Instructions (Addendum)
Sutured repair Keep the laceration site dry for the next 24 hours and leave the dressing in place. After 24 hours you may remove the dressing and gently clean the laceration site with antibacterial soap and warm water. Do not scrub the area. Do not soak the area and water for long periods of time. Don't use hydrogen peroxide, iodine-based solutions, or alcohol, which can slow healing, and will probably be painful! Apply topical bacitracin 1-2 times per day for the next 3-5 days. Return to the emergency department in 10-14 days for removal of the sutures.  You should return sooner for any signs of infection which would include increased redness around the wound, increased swelling, new drainage of yellow pus.   As discussed please follow-up with your primary care doctor and the next 3-4 days for wound check.  Try to avoid bending your knee as much as possible.

## 2020-01-21 NOTE — ED Provider Notes (Signed)
[facility] DEPT Provider Note   CSN: [id number] Arrival date & time: 01/21/20  2036     History Chief Complaint  Patient presents with  . Extremity Laceration    Gabrielle Foster is a 34 y.o. female.  HPI  Patient is a 34 year old female presented today with laceration to the right knee.  She states she sustained the injury approximately 2 hours ago.  She states that she was walking up the cement stairs into her house when she tripped and fell forward into the cement steps which cut her left knee.  She denies any bony pain and has had no difficulty walking but has had some significant bleeding.  She has any lightheadedness or dizziness.  She states she does not remember her last tetanus was but it was over 5 years ago.     Past Medical History:  Diagnosis Date  . Common migraine with intractable migraine 04/09/2015  . Headache(784.0)    Migraines  . Migraines     Patient Active Problem List   Diagnosis Date Noted  . SVD (spontaneous vaginal delivery) 12/24/2016  . Indication for care in labor or delivery 12/23/2016  . Common migraine with intractable migraine 04/09/2015  . Vacuum extractor delivery, delivered 08/15/2013  . Active labor 08/14/2013  . Transverse vaginal septum 07/07/2013  . Migraines 07/07/2013    Past Surgical History:  Procedure Laterality Date  . VULVA SURGERY     vaginal septum repair  . WISDOM TOOTH EXTRACTION       OB History    Gravida  2   Para  1   Term  1   Preterm      AB      Living  1     SAB      TAB      Ectopic      Multiple      Live Births  1           Family History  Problem Relation Age of Onset  . Kidney disease Mother   . Hyperlipidemia Mother   . Heart disease Mother   . Hypertension Father   . Migraines Father   . Migraines Sister     Social History   Tobacco Use  . Smoking status: Never Smoker  . Smokeless tobacco: Never Used  Substance Use Topics  .  Alcohol use: No    Alcohol/week: 0.0 standard drinks    Comment: social  . Drug use: No    Home Medications Prior to Admission medications   Medication Sig Start Date End Date Taking? Authorizing Provider  Collagen-Boron-Hyaluronic Acid 40-5-3.3 MG TABS Take 1 tablet by mouth daily.   Yes [provider]  eletriptan (RELPAX) 40 MG tablet Take 40 mg by mouth every 2 (two) hours as needed for migraine or headache.    Yes [provider]  escitalopram (LEXAPRO) 20 MG tablet Take 20 mg by mouth daily.   Yes [provider]  ibuprofen (ADVIL) 200 MG tablet Take 400-600 mg by mouth every 6 (six) hours as needed for headache, moderate pain or cramping.   Yes [provider]  norethindrone-ethinyl estradiol (LOESTRIN FE) 1-20 MG-MCG tablet Take 1 tablet by mouth daily.   Yes [provider]  vitamin B-12 (CYANOCOBALAMIN) 1000 MCG tablet Take 1,000 mcg by mouth daily.   Yes [provider]  ibuprofen (ADVIL,MOTRIN) 800 MG tablet Take 1 tablet (800 mg total) by mouth every 8 (eight) hours as needed.  Patient not taking: Reported on 01/21/2020 12/25/16   Bovard-Stuckert, Augusto Gamble, MD  Prenatal Vit-Fe Fumarate-FA (PRENATAL MULTIVITAMIN) TABS tablet Take 1 tablet by mouth daily at 12 noon. Patient not taking: Reported on 01/21/2020 12/25/16   Sherian Rein, MD    Allergies    Patient has no known allergies.  Review of Systems   Review of Systems  Constitutional: Negative for fever.  HENT: Negative for congestion.   Respiratory: Negative for shortness of breath.   Cardiovascular: Negative for chest pain.  Gastrointestinal: Negative for abdominal distention.  Skin: Positive for wound.  Neurological: Negative for dizziness and headaches.    Physical Exam Updated Vital Signs BP 130/85   Pulse 75   Temp 97.9 F (36.6 C)   Resp 16   SpO2 99%   Physical Exam Vitals and nursing note reviewed.  Constitutional:      General: She is not in  acute distress.    Appearance: Normal appearance. She is not ill-appearing.  HENT:     Head: Normocephalic and atraumatic.  Eyes:     General: No scleral icterus.       Right eye: No discharge.        Left eye: No discharge.     Conjunctiva/sclera: Conjunctivae normal.  Pulmonary:     Effort: Pulmonary effort is normal.     Breath sounds: No stridor.  Skin:    Comments: 3 cm laceration to the left anterior knee over the patella tendon  Neurological:     Mental Status: She is alert and oriented to person, place, and time. Mental status is at baseline.     ED Results / Procedures / Treatments   Labs (all labs ordered are listed, but only abnormal results are displayed) Labs Reviewed - No data to display  EKG None  Radiology No results found.  Procedures .Marland KitchenLaceration Repair  Date/Time: 01/22/2020 8:24 PM Performed by: Gailen Shelter, PA Authorized by: Gailen Shelter, PA   Consent:    Consent obtained:  Verbal   Consent given by:  Patient   Risks discussed:  Infection, need for additional repair, pain, poor cosmetic result and poor wound healing   Alternatives discussed:  No treatment and delayed treatment Universal protocol:    Procedure explained and questions answered to patient or proxy's satisfaction: yes     Relevant documents present and verified: yes     Test results available and properly labeled: yes     Imaging studies available: yes     Required blood products, implants, devices, and special equipment available: yes     Site/side marked: yes     Immediately prior to procedure, a time out was called: yes     Patient identity confirmed:  Verbally with patient Anesthesia (see MAR for exact dosages):    Anesthesia method:  Local infiltration Laceration details:    Location:  Leg   Leg location:  L knee   Length (cm):  3 Exploration:    Hemostasis achieved with:  Direct pressure   Wound extent: no foreign bodies/material noted and no tendon damage  noted     Contaminated: no   Treatment:    Area cleansed with:  Saline   Amount of cleaning:  Standard   Irrigation solution:  Sterile saline   Irrigation method:  Pressure wash   Visualized foreign bodies/material removed: no   Skin repair:    Repair method:  Sutures   Suture size:  3-0   Suture material:  Prolene  Number of sutures:  3 Approximation:    Approximation:  Close Post-procedure details:    Dressing:  Antibiotic ointment and non-adherent dressing   Patient tolerance of procedure:  Tolerated well, no immediate complications   (including critical care time)  Medications Ordered in ED Medications  Tdap (BOOSTRIX) injection 0.5 mL (0.5 mLs Intramuscular Given 01/21/20 2323)    ED Course  I have reviewed the triage vital signs and the nursing notes.  Pertinent labs & imaging results that were available during my care of the patient were reviewed by me and considered in my medical decision making (see chart for details).    MDM Rules/Calculators/A&P                          Pressure irrigation performed. Wound explored and base of wound visualized in a bloodless field without evidence of foreign body.  Laceration occurred < 8 hours prior to repair which was well tolerated. Tdap updated.  Pt has no comorbidities to effect normal wound healing. Pt discharged without antibiotics.  Discussed suture home care with patient and answered questions. Pt to follow-up for wound check and suture removal in 10-14 days; they are to return to the ED sooner for signs of infection. Pt is hemodynamically stable with no complaints prior to dc.   Final Clinical Impression(s) / ED Diagnoses Final diagnoses:  Knee laceration, left, initial encounter    Rx / DC Orders ED Discharge Orders    None       Gailen Shelter, [place] 01/22/20 2025    Vanetta Mulders, MD 01/30/20 1704

## 2020-01-21 NOTE — ED Triage Notes (Signed)
Pt reports she cut her knee on a brick step. Lac to the anterior left knee, bleeding controlled.

## 2020-04-06 DIAGNOSIS — F418 Other specified anxiety disorders: Secondary | ICD-10-CM | POA: Diagnosis not present

## 2020-04-06 DIAGNOSIS — G43909 Migraine, unspecified, not intractable, without status migrainosus: Secondary | ICD-10-CM | POA: Diagnosis not present

## 2020-05-22 DIAGNOSIS — Z30431 Encounter for routine checking of intrauterine contraceptive device: Secondary | ICD-10-CM | POA: Diagnosis not present

## 2020-05-22 DIAGNOSIS — Z975 Presence of (intrauterine) contraceptive device: Secondary | ICD-10-CM | POA: Diagnosis not present

## 2020-07-27 DIAGNOSIS — G43909 Migraine, unspecified, not intractable, without status migrainosus: Secondary | ICD-10-CM | POA: Diagnosis not present

## 2020-07-27 DIAGNOSIS — F418 Other specified anxiety disorders: Secondary | ICD-10-CM | POA: Diagnosis not present

## 2020-08-05 DIAGNOSIS — S5291XA Unspecified fracture of right forearm, initial encounter for closed fracture: Secondary | ICD-10-CM | POA: Diagnosis not present

## 2020-08-05 DIAGNOSIS — W109XXA Fall (on) (from) unspecified stairs and steps, initial encounter: Secondary | ICD-10-CM | POA: Diagnosis not present

## 2020-08-05 DIAGNOSIS — O9A213 Injury, poisoning and certain other consequences of external causes complicating pregnancy, third trimester: Secondary | ICD-10-CM | POA: Diagnosis not present

## 2020-08-05 DIAGNOSIS — S8000XA Contusion of unspecified knee, initial encounter: Secondary | ICD-10-CM | POA: Diagnosis not present

## 2020-08-05 DIAGNOSIS — S52501A Unspecified fracture of the lower end of right radius, initial encounter for closed fracture: Secondary | ICD-10-CM | POA: Diagnosis not present

## 2020-08-05 DIAGNOSIS — Z3A32 32 weeks gestation of pregnancy: Secondary | ICD-10-CM | POA: Diagnosis not present

## 2020-08-20 DIAGNOSIS — D2261 Melanocytic nevi of right upper limb, including shoulder: Secondary | ICD-10-CM | POA: Diagnosis not present

## 2020-08-20 DIAGNOSIS — L905 Scar conditions and fibrosis of skin: Secondary | ICD-10-CM | POA: Diagnosis not present

## 2020-08-20 DIAGNOSIS — D225 Melanocytic nevi of trunk: Secondary | ICD-10-CM | POA: Diagnosis not present

## 2020-08-20 DIAGNOSIS — D485 Neoplasm of uncertain behavior of skin: Secondary | ICD-10-CM | POA: Diagnosis not present

## 2020-08-20 DIAGNOSIS — Z872 Personal history of diseases of the skin and subcutaneous tissue: Secondary | ICD-10-CM | POA: Diagnosis not present

## 2020-08-20 DIAGNOSIS — D229 Melanocytic nevi, unspecified: Secondary | ICD-10-CM | POA: Diagnosis not present

## 2020-09-06 ENCOUNTER — Other Ambulatory Visit: Payer: Self-pay | Admitting: Family Medicine

## 2020-09-06 ENCOUNTER — Ambulatory Visit
Admission: RE | Admit: 2020-09-06 | Discharge: 2020-09-06 | Disposition: A | Discharge status: Home / Self Care | Payer: PRIVATE HEALTH INSURANCE | Source: Ambulatory Visit | Attending: Family Medicine | Admitting: Family Medicine

## 2020-09-06 DIAGNOSIS — R0789 Other chest pain: Secondary | ICD-10-CM

## 2020-09-07 DIAGNOSIS — G43909 Migraine, unspecified, not intractable, without status migrainosus: Secondary | ICD-10-CM | POA: Diagnosis not present

## 2020-09-07 DIAGNOSIS — R0789 Other chest pain: Secondary | ICD-10-CM | POA: Diagnosis not present

## 2020-09-07 DIAGNOSIS — R0602 Shortness of breath: Secondary | ICD-10-CM | POA: Diagnosis not present

## 2020-09-07 DIAGNOSIS — F430 Acute stress reaction: Secondary | ICD-10-CM | POA: Diagnosis not present

## 2020-09-07 DIAGNOSIS — R7989 Other specified abnormal findings of blood chemistry: Secondary | ICD-10-CM | POA: Diagnosis not present

## 2020-09-12 ENCOUNTER — Telehealth: Payer: Self-pay

## 2020-09-12 NOTE — Telephone Encounter (Signed)
NOTES ON [facility] [phone], SENT REFERRAL TO SCHEDULING 

## 2020-09-16 NOTE — Progress Notes (Signed)
Cardiology Office Note:    Date:  09/17/2020   ID:  Gabrielle Foster, DOB 09-15-1985, MRN [id number]  PCP:  Farris Has, MD  [facility] Cardiologist:  No primary care provider on file.  [facility] Electrophysiologist:  None   Referring MD: Farris Has, MD    History of Present Illness:    Gabrielle Foster is a 35 y.o. female with a hx of anxiety and depression who was referred by Dr. Kateri Plummer for further evaluation of chest pain.  The patient states that several weeks ago, she noticed severe SOB as well as pressure/pain in the chest. Symptoms initially occurred at rest and she attributed it to sleeping wrong overnight. Symptoms, however, worsened over the weekend and became exertional in nature with worsening SOB and chest discomfort. She presented to her PCP who was concerned about a possible PE and CTA-PE protocol was ordered. Fortunately, the scan was negative. Over the course of the past several days, her symptoms have improved somewhat but she feels limited in terms of her exercise capacity. Specifically, she is very active at baseline and exercises daily usually without issues. She has not been able to exercise for the past 2 weeks due to symptoms. No known recent COVID, but has been exposed many times at work. No symptoms of COVID (no fevers, chills, cough, congestion). Has underlying anxiety but has not worsened or manifested like this before. No recent stressors in her life.   Family history notable for Afib; mother with DVT (late 59-50s), Grandmother with CVA in the setting of Afib.  Past Medical History:  Diagnosis Date  . Anxiety   . Common migraine with intractable migraine 04/09/2015  . Depression   . Headache(784.0)    Migraines  . Migraines     Past Surgical History:  Procedure Laterality Date  . VULVA SURGERY     vaginal septum repair  . WISDOM TOOTH EXTRACTION      Current Medications: Current Meds  Medication Sig  . Ashwagandha 125 MG  CAPS Take by mouth.  . eletriptan (RELPAX) 40 MG tablet Take 40 mg by mouth every 2 (two) hours as needed for migraine or headache.      Allergies:   Patient has no known allergies.   Social History   Socioeconomic History  . Marital status: Married    Spouse name: Not on file  . Number of children: 1  . Years of education: master's  . Highest education level: Not on file  Occupational History  . Occupation: Freight forwarder  Tobacco Use  . Smoking status: Never Smoker  . Smokeless tobacco: Never Used  Substance and Sexual Activity  . Alcohol use: No    Alcohol/week: 0.0 standard drinks    Comment: social  . Drug use: No  . Sexual activity: Yes  Other Topics Concern  . Not on file  Social History Narrative   Patient drinks 1-2 cups of caffeine daily.   Patient is right handed.   Social Determinants of Health   Financial Resource Strain: Not on file  Food Insecurity: Not on file  Transportation Needs: Not on file  Physical Activity: Not on file  Stress: Not on file  Social Connections: Not on file     Family History: The patient's family history includes Heart disease in her mother; Hyperlipidemia in her mother; Hypertension in her father; Kidney disease in her mother; Migraines in her father and sister.  ROS:   Please see the history of present illness.  Review of Systems  Constitutional: Positive for malaise/fatigue. Negative for chills and fever.  HENT: Negative for congestion.   Eyes: Negative for blurred vision and redness.  Respiratory: Positive for shortness of breath.   Cardiovascular: Positive for chest pain. Negative for palpitations, orthopnea, claudication, leg swelling and PND.  Gastrointestinal: Negative for nausea and vomiting.  Genitourinary: Negative for hematuria.  Musculoskeletal: Negative for falls.  Neurological: Negative for dizziness and loss of consciousness.  Endo/Heme/Allergies: Negative for polydipsia.   Psychiatric/Behavioral: Negative for substance abuse. The patient is nervous/anxious.     EKGs/Labs/Other Studies Reviewed:    The following studies were reviewed today: CTA PE 08/2020 FINDINGS:  # Pulmonary arteries:No evidence of pulmonary embolism.   # Aorta:No evidence of dissection or other acute pathology.   # Mediastinum: Heart and thoracic lymph nodes are normal in size.   # Abdomen:No acute findings in the visible abdominal structures.   # Lungs:Lungs are clear aside from a miniscule calcified granuloma in the inferomedial right lower lobe.   # MSK:No acute or aggressive bony abnormalities.   EKG:  EKG is  ordered today.  The ekg ordered today demonstrates NSR with HR 81  Recent Labs: No results found for requested labs within last 8760 hours.  Recent Lipid Panel No results found for: CHOL, TRIG, HDL, CHOLHDL, VLDL, LDLCALC, LDLDIRECT   Physical Exam:    VS:  BP 100/74   Pulse 86   Ht 5\' 7"  (1.702 m)   Wt 200 lb 6.4 oz (90.9 kg)   LMP 09/06/2020 (Exact Date)   SpO2 96%   BMI 31.39 kg/m     Wt Readings from Last 3 Encounters:  09/17/20 200 lb 6.4 oz (90.9 kg)  12/23/16 192 lb (87.1 kg)  12/22/16 190 lb (86.2 kg)     GEN:  Well nourished, well developed in no acute distress HEENT: Normal NECK: No JVD; No carotid bruits CARDIAC: RRR, no murmurs, rubs, gallops RESPIRATORY:  Clear to auscultation without rales, wheezing or rhonchi  ABDOMEN: Soft, non-tender, non-distended MUSCULOSKELETAL:  No edema; No deformity  SKIN: Warm and dry NEUROLOGIC:  Alert and oriented x 3 PSYCHIATRIC:  Normal affect   ASSESSMENT:    1. Chest pain of uncertain etiology   2. Dyspnea on exertion   3. Anxiety    PLAN:    In order of problems listed above:  #Chest Pain: #DOE: Patient with chest tightness and SOB that has been occurring over the past several weeks. Initially occurred at rest but now more exertional in nature. CT-PE protocol negative. No recent  COVID that she knows of. Has underlying anxiety but no recent stressors and her symptoms have not manifested in this way before. Family history notable for DVT in Mother and Afib with CVA in her grandmother. Low suspicion for ischemia driving symptoms given age and lack of comorbidites, but given persistence of symptoms and exertional nature at this time, will check exercise stress and echo for further evaluation.  -Check exercise stress test -Obtain TTE -CT-PE protocol negative -If above testing negative, follow-up with PCP as scheduled for further management  #Anxiety: -Management per PCP  Shared Decision Making/Informed Consent The risks [chest pain, shortness of breath, cardiac arrhythmias, dizziness, blood pressure fluctuations, myocardial infarction, stroke/transient ischemic attack, and life-threatening complications (estimated to be 1 in 10,000)], benefits (risk stratification, diagnosing coronary artery disease, treatment guidance) and alternatives of an exercise tolerance test were discussed in detail with Ms. 12/24/16 and she agrees to proceed.    Medication Adjustments/Labs  and Tests Ordered: Current medicines are reviewed at length with the patient today.  Concerns regarding medicines are outlined above.  Orders Placed This Encounter  Procedures  . EXERCISE TOLERANCE TEST (ETT)  . EKG 12-Lead  . ECHOCARDIOGRAM COMPLETE   No orders of the defined types were placed in this encounter.   Patient Instructions  Medication Instructions:  No changes *If you need a refill on your cardiac medications before your next appointment, please call your pharmacy*   Lab Work: none If you have labs (blood work) drawn today and your tests are completely normal, you will receive your results only by: Marland Kitchen MyChart Message (if you have MyChart) OR . A paper copy in the mail If you have any lab test that is abnormal or we need to change your treatment, we will call you to review the  results.   Testing/Procedures: Your physician has requested that you have an echocardiogram. Echocardiography is a painless test that uses sound waves to create images of your heart. It provides your doctor with information about the size and shape of your heart and how well your heart's chambers and valves are working. This procedure takes approximately one hour. There are no restrictions for this procedure.  Your physician has requested that you have an exercise tolerance test. For further information please visit [email or web address] Please also follow instruction sheet, as given.   Follow-Up: Your physician recommends that you schedule a follow-up appointment as needed with Dr. Shari Prows.  Other Instructions      Signed, Meriam Sprague, MD  09/17/2020 10:30 AM    [facility]

## 2020-09-17 ENCOUNTER — Encounter: Payer: Self-pay | Admitting: Cardiology

## 2020-09-17 ENCOUNTER — Ambulatory Visit: Payer: BC Managed Care – PPO | Admitting: Cardiology

## 2020-09-17 ENCOUNTER — Other Ambulatory Visit: Payer: Self-pay

## 2020-09-17 VITALS — BP 100/74 | HR 86 | Ht 67.0 in | Wt 200.4 lb

## 2020-09-17 DIAGNOSIS — R079 Chest pain, unspecified: Secondary | ICD-10-CM | POA: Diagnosis not present

## 2020-09-17 DIAGNOSIS — R0600 Dyspnea, unspecified: Secondary | ICD-10-CM | POA: Diagnosis not present

## 2020-09-17 DIAGNOSIS — F419 Anxiety disorder, unspecified: Secondary | ICD-10-CM

## 2020-09-17 DIAGNOSIS — R0609 Other forms of dyspnea: Secondary | ICD-10-CM

## 2020-09-17 NOTE — Patient Instructions (Signed)
Medication Instructions:  No changes *If you need a refill on your cardiac medications before your next appointment, please call your pharmacy*   Lab Work: none If you have labs (blood work) drawn today and your tests are completely normal, you will receive your results only by: Marland Kitchen MyChart Message (if you have MyChart) OR . A paper copy in the mail If you have any lab test that is abnormal or we need to change your treatment, we will call you to review the results.   Testing/Procedures: Your physician has requested that you have an echocardiogram. Echocardiography is a painless test that uses sound waves to create images of your heart. It provides your doctor with information about the size and shape of your heart and how well your heart's chambers and valves are working. This procedure takes approximately one hour. There are no restrictions for this procedure.  Your physician has requested that you have an exercise tolerance test. For further information please visit [email or web address] Please also follow instruction sheet, as given.   Follow-Up: Your physician recommends that you schedule a follow-up appointment as needed with Dr. Shari Prows.  Other Instructions

## 2020-09-28 DIAGNOSIS — Z130 Encounter for screening for diseases of the blood and blood-forming organs and certain disorders involving the immune mechanism: Secondary | ICD-10-CM | POA: Diagnosis not present

## 2020-09-28 DIAGNOSIS — Z6830 Body mass index (BMI) 30.0-30.9, adult: Secondary | ICD-10-CM | POA: Diagnosis not present

## 2020-09-28 DIAGNOSIS — Z124 Encounter for screening for malignant neoplasm of cervix: Secondary | ICD-10-CM | POA: Diagnosis not present

## 2020-09-28 DIAGNOSIS — Z01419 Encounter for gynecological examination (general) (routine) without abnormal findings: Secondary | ICD-10-CM | POA: Diagnosis not present

## 2020-10-20 ENCOUNTER — Other Ambulatory Visit (HOSPITAL_COMMUNITY)
Admission: RE | Admit: 2020-10-20 | Discharge: 2020-10-20 | Disposition: A | Discharge status: Home / Self Care | Payer: BC Managed Care – PPO | Source: Ambulatory Visit | Attending: Cardiology | Admitting: Cardiology

## 2020-10-20 DIAGNOSIS — Z20822 Contact with and (suspected) exposure to covid-19: Secondary | ICD-10-CM | POA: Diagnosis not present

## 2020-10-20 DIAGNOSIS — Z01812 Encounter for preprocedural laboratory examination: Secondary | ICD-10-CM | POA: Diagnosis not present

## 2020-10-20 LAB — SARS CORONAVIRUS 2 (TAT 6-24 HRS): SARS Coronavirus 2: NEGATIVE

## 2020-10-23 ENCOUNTER — Other Ambulatory Visit: Payer: Self-pay

## 2020-10-23 ENCOUNTER — Ambulatory Visit (INDEPENDENT_AMBULATORY_CARE_PROVIDER_SITE_OTHER): Payer: BC Managed Care – PPO

## 2020-10-23 ENCOUNTER — Ambulatory Visit (HOSPITAL_COMMUNITY): Payer: BC Managed Care – PPO | Attending: Cardiovascular Disease

## 2020-10-23 DIAGNOSIS — R079 Chest pain, unspecified: Secondary | ICD-10-CM

## 2020-10-23 LAB — EXERCISE TOLERANCE TEST
Estimated workload: 10.4 METS
Exercise duration (min): 9 min
Exercise duration (sec): 0 s
MPHR: 186 {beats}/min
Peak HR: 184 {beats}/min
Percent HR: 98 %
RPE: 16
Rest HR: 79 {beats}/min

## 2020-10-23 LAB — ECHOCARDIOGRAM COMPLETE
Area-P 1/2: 4.06 cm2
S' Lateral: 2.7 cm

## 2020-10-24 ENCOUNTER — Telehealth: Payer: Self-pay

## 2020-10-24 NOTE — Telephone Encounter (Signed)
Gabrielle Sprague, MD  10/24/2020 8:02 AM EDT      Exercise stress test was normal. This is great news! Likely her chest pain is not caused by blockages in her heart arteries.   Left message for patient with results (ok per DPR). Advised to call back with any questions.

## 2020-10-24 NOTE — Telephone Encounter (Signed)
-----   Message from Meriam Sprague, MD sent at 10/24/2020  8:18 AM EDT ----- Her echo is also normal with normal pumping function and no significant valve abnormalities. Sorry, I meant to put that in her other result note.

## 2021-01-16 DIAGNOSIS — G43909 Migraine, unspecified, not intractable, without status migrainosus: Secondary | ICD-10-CM | POA: Diagnosis not present

## 2021-02-21 DIAGNOSIS — D225 Melanocytic nevi of trunk: Secondary | ICD-10-CM | POA: Diagnosis not present

## 2021-02-21 DIAGNOSIS — Z872 Personal history of diseases of the skin and subcutaneous tissue: Secondary | ICD-10-CM | POA: Diagnosis not present

## 2021-02-21 DIAGNOSIS — L905 Scar conditions and fibrosis of skin: Secondary | ICD-10-CM | POA: Diagnosis not present

## 2021-02-21 DIAGNOSIS — L814 Other melanin hyperpigmentation: Secondary | ICD-10-CM | POA: Diagnosis not present

## 2021-04-01 DIAGNOSIS — R21 Rash and other nonspecific skin eruption: Secondary | ICD-10-CM | POA: Diagnosis not present

## 2021-04-03 IMAGING — DX DG CHEST 2V
2 series · 2 of 2 positions shown · non-contrast
Comparison: Chest radiograph 02/20/2016

CLINICAL DATA: Chest tightness x 6 days. No hx of heart/lung
disease.

EXAM:
CHEST - 2 VIEW

[dg chest 2 view (1 of 2)]
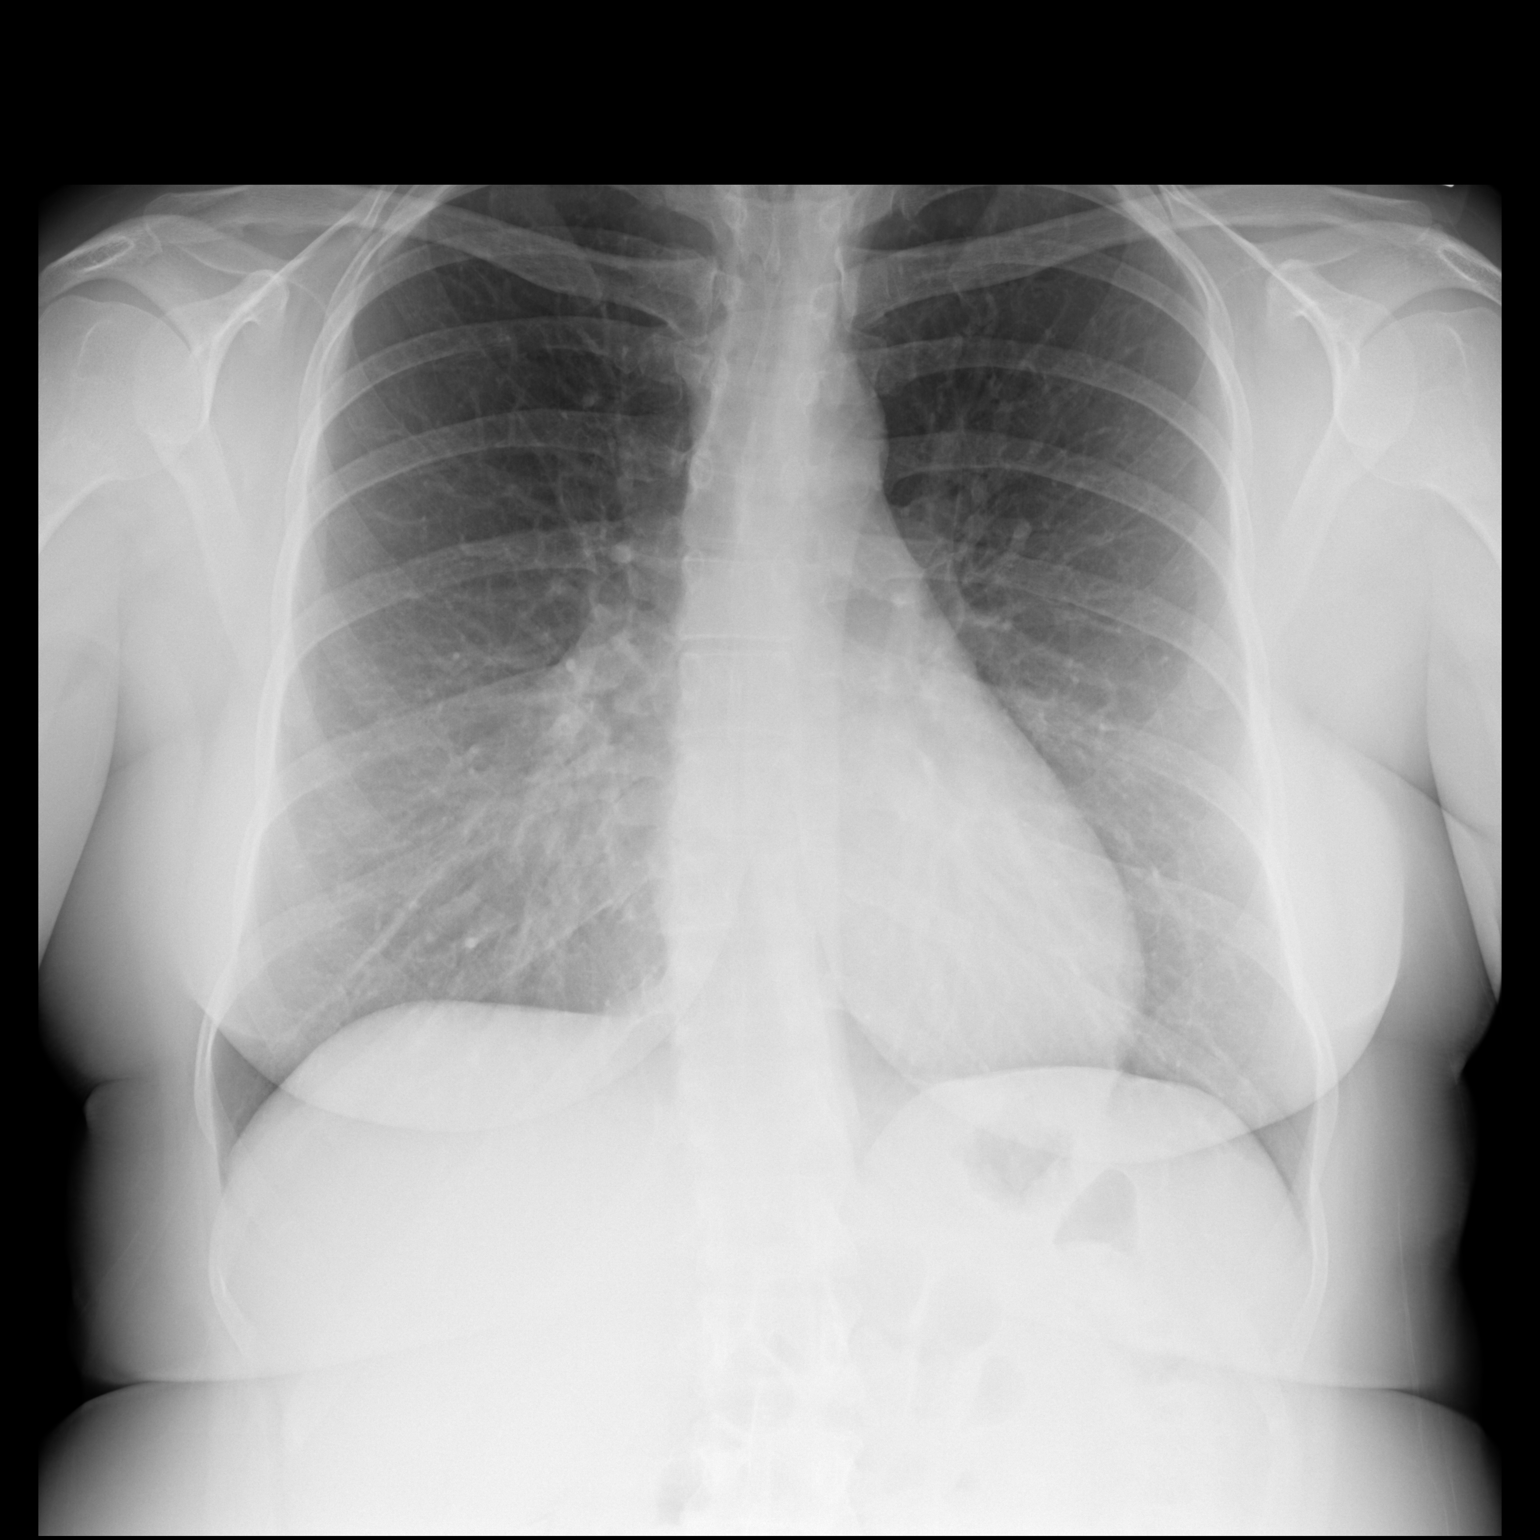

[dg chest 2 view (2 of 2)]
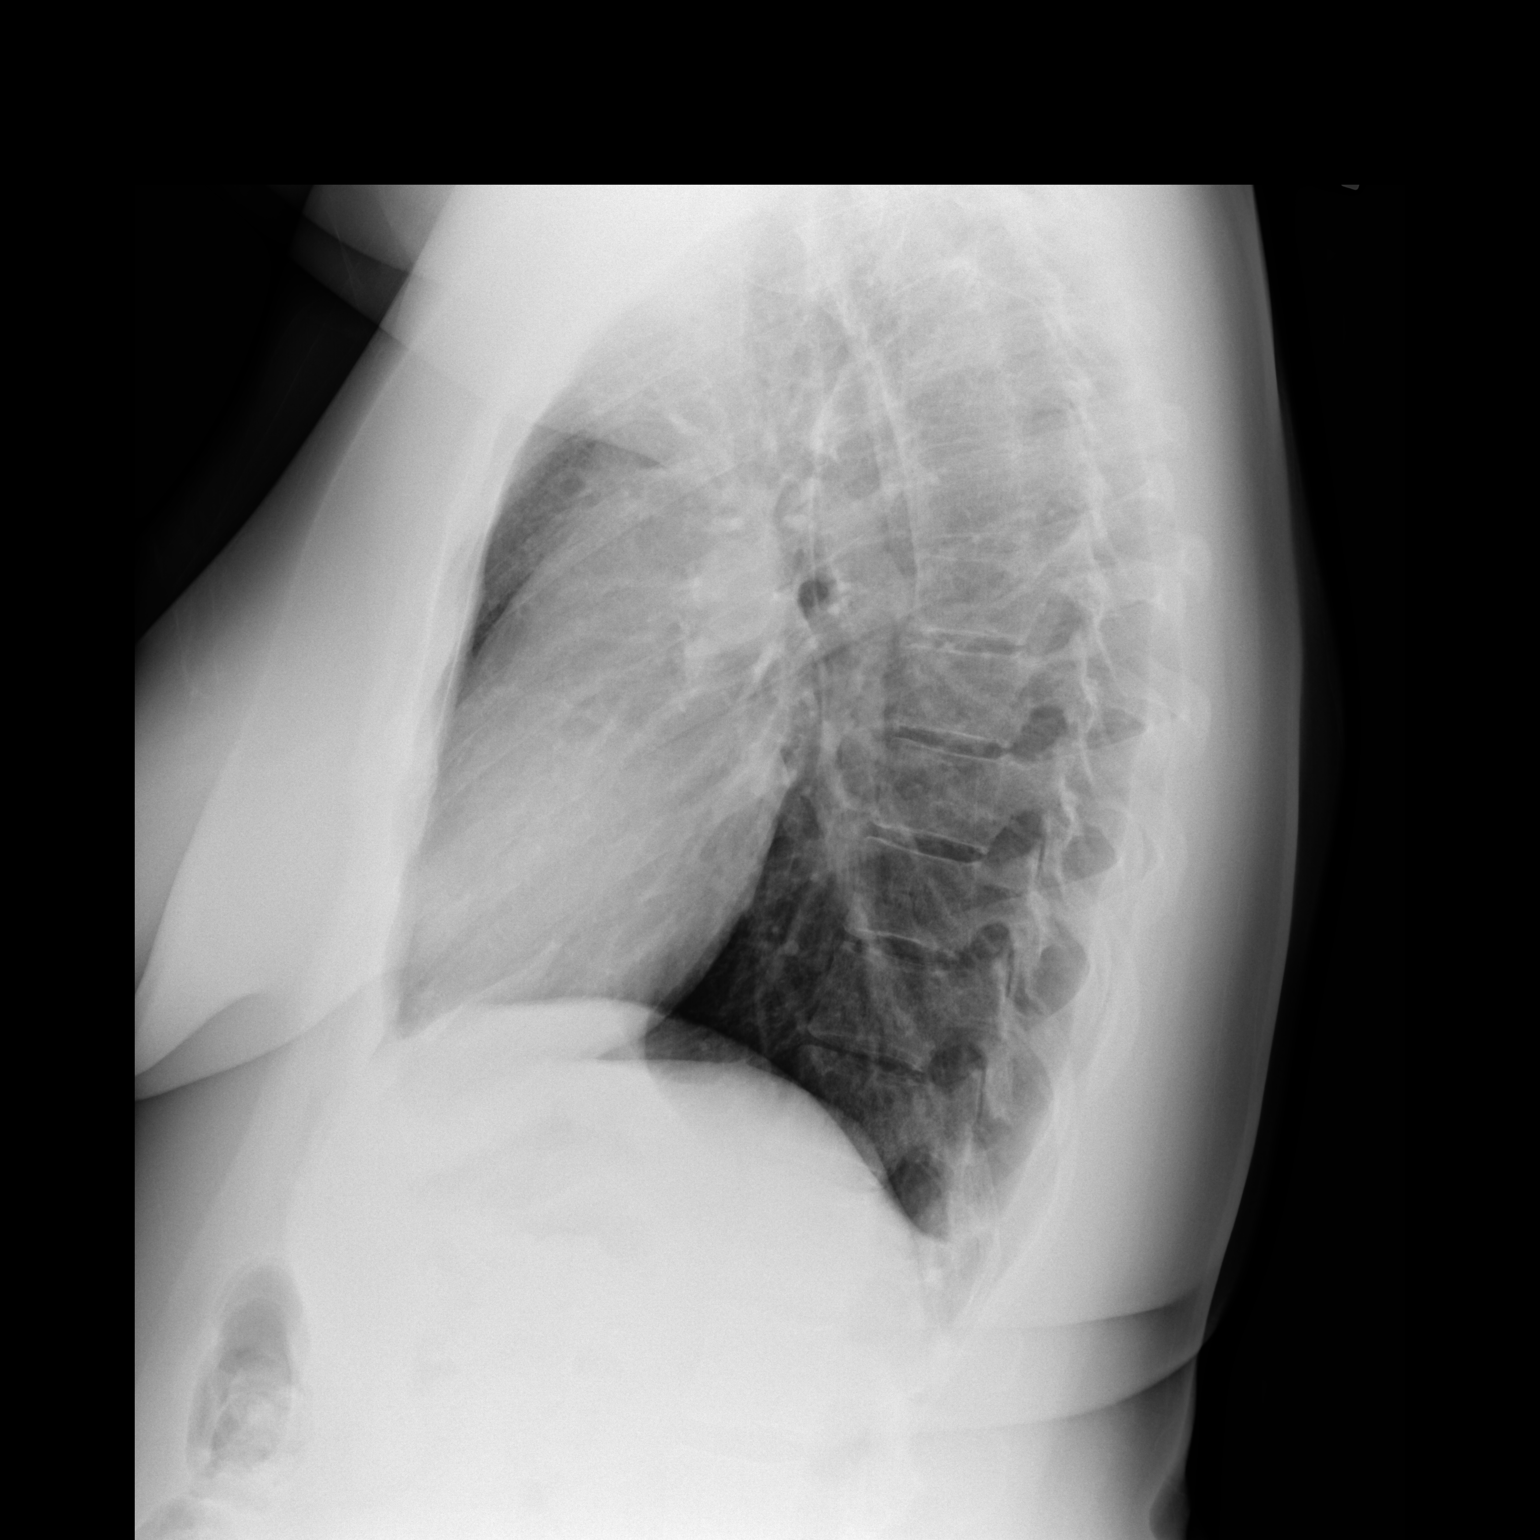

[2 of 2 positions shown; findings below may reference images not displayed]

FINDINGS: The cardiomediastinal contours are within normal limits. The lungs
are clear. No pneumothorax or pleural effusion. No acute finding in
the visualized skeleton.
IMPRESSION: No acute cardiopulmonary process.

## 2021-04-10 DIAGNOSIS — R21 Rash and other nonspecific skin eruption: Secondary | ICD-10-CM | POA: Diagnosis not present

## 2021-05-14 ENCOUNTER — Encounter: Payer: Self-pay | Admitting: *Deleted

## 2021-05-15 ENCOUNTER — Institutional Professional Consult (permissible substitution): Payer: BC Managed Care – PPO | Admitting: Psychiatry

## 2021-06-03 ENCOUNTER — Encounter: Payer: Self-pay | Admitting: Psychiatry

## 2021-06-03 ENCOUNTER — Ambulatory Visit: Payer: BC Managed Care – PPO | Admitting: Psychiatry

## 2021-06-03 VITALS — BP 106/70 | HR 80 | Ht 68.0 in | Wt 195.0 lb

## 2021-06-03 DIAGNOSIS — G43119 Migraine with aura, intractable, without status migrainosus: Secondary | ICD-10-CM | POA: Diagnosis not present

## 2021-06-03 MED ORDER — ZONISAMIDE 100 MG PO CAPS: ORAL_CAPSULE | ORAL | 2 refills | Status: DC

## 2021-06-03 MED ORDER — ELETRIPTAN HYDROBROMIDE 40 MG PO TABS: 40.0000 mg | ORAL_TABLET | ORAL | 3 refills | Status: DC | PRN

## 2021-06-03 NOTE — Patient Instructions (Addendum)
RECOMMENDATIONS 1. Neck PT 2. Start Zonisamide 100 mg daily for two weeks, then increase to 200 mg daily. 3. Try to limit Relpax use to 2 days per week or less to avoid rebound headaches 4. Research monthly injectables - Aimovig, Emgality, Ajovy   GENERAL HEADACHE INSTRUCTIONS Headache Preventive Treatment: Please keep in mind that it takes 4-6 weeks for the medication to start working well and 2-3 months at the appropriate dose before deciding if it will be useful or not. If it is not helping at all by this time, then we will discuss other medications to try. Supplements may take 3-6 months until you see full effect.   Natural supplements: Magnesium Oxide or Magnesium Glycinate 500 mg at bed (up to 800 mg daily) Coenzyme Q10 300 mg in AM Vitamin B2- 200 mg twice a day  Add 1 supplement at a time since even natural supplements can have undesirable side effects. You can sometimes buy supplements cheaper (especially Coenzyme Q10) at www.WebmailGuide.co.za or at [employer].  Vitamins and herbs that show potential:  Magnesium: Magnesium (250 mg twice a day or 500 mg at bed) has a relaxant effect on smooth muscles such as blood vessels. Individuals suffering from frequent or daily headache usually have low magnesium levels which can be increase with daily supplementation of 400-750 mg. Three trials found 40-90% average headache reduction  when used as a preventative. Magnesium also demonstrated the benefit in menstrually related migraine.  Magnesium is part of the messenger system in the serotonin cascade and it is a good muscle relaxant.  It is also useful for constipation which can be a side effect of other medications used to treat migraine. Good sources include nuts, whole grains, and tomatoes. Side Effects: loose stool/diarrhea Riboflavin (vitamin B 2) 200 mg twice a day. This vitamin assists nerve cells in the production of ATP a principal energy storing molecule.  It is necessary for many chemical  reactions in the body.  There have been at least 3 clinical trials of riboflavin using 400 mg per day all of which suggested that migraine frequency can be decreased.  All 3 trials showed significant improvement in over half of migraine sufferers.  The supplement is found in bread, cereal, milk, meat, and poultry.  Most Americans get more riboflavin than the recommended daily allowance, however riboflavin deficiency is not necessary for the supplements to help prevent headache. Side effects: energizing, green urine  Coenzyme Q10: This is present in almost all cells in the body and is critical component for the conversion of energy.  Recent studies have shown that a nutritional supplement of CoQ10 can reduce the frequency of migraine attacks by improving the energy production of cells as with riboflavin.  Doses of 150 mg twice a day have been shown to be effective.  Melatonin: Increasing evidence shows correlation between melatonin secretion and headache conditions.  Melatonin supplementation has decreased headache intensity and duration.  It is widely used as a sleep aid.  Sleep is natures way of dealing with migraine.  A dose of 3 mg is recommended to start for headaches including cluster headache. Higher doses up to 15 mg has been reviewed for use in Cluster headache and have been used. The rationale behind using melatonin for cluster is that many theories regarding the cause of Cluster headache center around the disruption of the normal circadian rhythm in the brain.  This helps restore the normal circadian rhythm.  Ginger: Ginger has a small amount of antihistamine and anti-inflammatory action  which may help headache.  It is primarily used for nausea and may aid in the absorption of other medications. HEADACHE DIET: Foods and beverages which may trigger migraine Note that only 20% of headache patients are food sensitive. You will know if you are food sensitive if you get a headache consistently 20  minutes to 2 hours after eating a certain food. Only cut out a food if it causes headaches, otherwise you might remove foods you enjoy! What matters most for diet is to eat a well balanced healthy diet full of vegetables and low fat protein, and to not miss meals.  Chocolate, other sweets ALL cheeses except cottage and cream cheese Dairy products, yogurt, sour cream, ice cream Liver Meat extracts (Bovril, Marmite, meat tenderizers) Meats or fish which have undergone aging, fermenting, pickling or smoking. These include: Hotdogs,salami,Lox,sausage, mortadellas,smoked salmon, pepperoni, Pickled herring Pods of broad bean (English beans, Chinese pea pods, [place] (fava) beans, lima and navy beans Ripe avocado, ripe banana Yeast extracts or active yeast preparations such as Brewer's or Fleishman's (commercial bakes goods are permitted) Tomato based foods, pizza (lasagna, etc.)  MSG (monosodium glutamate) is disguised as many things; look for these common aliases: Monopotassium glutamate Autolysed yeast Hydrolysed protein Sodium caseinate "flavorings" "all natural preservatives" Nutrasweet  Avoid all other foods that convincingly provoke headaches.  Resources: The Dizzy [place]air Laundry Your Headache Diet, migrainestrong.com  https://zamora-andrews.com/  Caffeine and Migraine For patients that have migraine, caffeine intake more than 3 days per week can lead to dependency and increased migraine frequency. I would recommend cutting back on your caffeine intake as best you can. The recommended amount of caffeine is 200-300 mg daily, although migraine patients may experience dependency at even lower doses. While you may notice an increase in headache temporarily, cutting back will be helpful for headaches in the long run. For more information on caffeine and migraine, visit:  https://americanmigrainefoundation.org/resource-library/caffeine-and-migraine/  Headache Prevention Strategies:  1. Maintain a headache diary; learn to identify and avoid triggers.  - This can be a simple note where you log when you had a headache, associated symptoms, and medications used - There are several smartphone apps developed to help track migraines: Migraine Buddy, Migraine Monitor, Curelator N1-Headache App  Common triggers include: Emotional triggers: Emotional/Upset family or friends Emotional/Upset occupation Business reversal/success Anticipation anxiety Crisis-serious Post-crisis periodNew job/position   Physical triggers: Vacation Day Weekend Strenuous Exercise High Altitude Location New Move Menstrual Day Physical Illness Oversleep/Not enough sleep Weather changes Light: Photophobia or light sesnitivity treatment involves a balance between desensitization and reduction in overly strong input. Use dark polarized glasses outside, but not inside. Avoid bright or fluorescent light, but do not dim environment to the point that going into a normally lit room hurts. Consider FL-41 tint lenses, which reduce the most irritating wavelengths without blocking too much light.  These can be obtained at axonoptics.com or theraspecs.com Foods: see list above.  2. Limit use of acute treatments (over-the-counter medications, triptans, etc.) to no more than 2 days per week or 10 days per month to prevent medication overuse headache (rebound headache).    3. Follow a regular schedule (including weekends and holidays): Don't skip meals. Eat a balanced diet. 8 hours of sleep nightly. Minimize stress. Exercise 30 minutes per day. Being overweight is associated with a 5 times increased risk of chronic migraine. Keep well hydrated and drink 6-8 glasses of water per day.  4. Initiate non-pharmacologic measures at the earliest onset of your headache. Rest and quiet environment.  Relax  and reduce stress. Breathe2Relax is a free app that can instruct you on    some simple relaxtion and breathing techniques. Http://Dawnbuse.com is a    free website that provides teaching videos on relaxation.  Also, there are  many apps that   can be downloaded for "mindful" relaxation.  An app called YOGA NIDRA will help walk you through mindfulness. Another app called Calm can be downloaded to give you a structured mindfulness guide with daily reminders and skill development. Headspace for guided meditation Mindfulness Based Stress Reduction Online Course: www.palousemindfulness.com Cold compresses.  5. Don't wait!! Take the maximum allowable dosage of prescribed medication at the first sign of migraine.  6. Compliance:  Take prescribed medication regularly as directed and at the first sign of a migraine.  7. Communicate:  Call your physician when problems arise, especially if your headaches change, increase in frequency/severity, or become associated with neurological symptoms (weakness, numbness, slurred speech, etc.).  8. Headache/pain management therapies: Consider various complementary methods, including medication, behavioral therapy, psychological counselling, biofeedback, massage therapy, acupuncture, dry needling, and other modalities.  Such measures may reduce the need for medications. Counseling for pain management, where patients learn to function and ignore/minimize their pain, seems to work very well.  9. Helpful Websites: www.AmericanHeadacheSociety.org PatentHood.ch www.headaches.org TightMarket.nl www.achenet.org week.

## 2021-06-03 NOTE — Progress Notes (Signed)
Referring:  Farris Has, MD [address] [place],  [place] [address]  PCP: Farris Has, MD  Neurology was asked to evaluate Gabrielle Foster, a 35 year old female for a chief complaint of headaches.  Our recommendations of care will be communicated by shared medical record.    CC:  headaches  HPI:  Medical co-morbidities: none  The patient presents for evaluation of migraines which have been present since elementary school. She previously followed with Dr. Anne Hahn in 2016 who prescribed Topamax for prevention and Relpax for rescue. She was unable to tolerate Topamax due to side effects.  Currently she takes Relpax as needed for migraines which works well for her. She has noticed that migraines worsen right before her period and in the first week of her period.  She used to be able to go 1-2 months without a migraine. However in the past 3 months she has been using all 12 of her Relpax. She has been taking Relpax ~3 times per week. She does have a few lower level headache days per month as well as some pain-free days.  Headache History: Onset: childhood Triggers: alcohol, smells, dehydration, fasting, menstruation Aura: rarely visual aura Location: left temporal, occipital Quality/Description: throbbing Associated Symptoms:  Photophobia: yes  Phonophobia: yes  Nausea: yes Worse with activity?: yes Duration of headaches: 30 minutes with Relpax  Headache days per month: 15 Headache free days per month: 15  Current Treatment: Abortive Relpax 40 mg PRN  Preventative none  Prior Therapies                                 Topamax 75 mg QHS- paresthesias, fatigue, brain fog Trokendi Lexapro Amitriptyline 10 mg - weight gain Relpax 40 mg magnesium  Headache Risk Factors: Headache risk factors and/or co-morbidities (+) Neck Pain (-) History of Motor Vehicle Accident (+) Obesity  Body mass index is 29.65 kg/m. (-) History of Traumatic Brain  Injury and/or Concussion  LABS: 09/06/20: TSH, CBC, CMP wnl  IMAGING:  N/A   No current outpatient medications on file prior to visit.   No current facility-administered medications on file prior to visit.     Allergies: No Known Allergies  Family History: Migraine or other headaches in the family:  father and sister Aneurysms in a first degree relative:  no Brain tumors in the family:  no Other neurological illness in the family:   grandmother had TIAs  Past Medical History: Past Medical History:  Diagnosis Date   ADD (attention deficit disorder)    as child   Anxiety    Common migraine with intractable migraine 04/09/2015   Depression    Headache(784.0)    Migraines   Migraines     Past Surgical History Past Surgical History:  Procedure Laterality Date   VULVA SURGERY     vaginal septum repair   WISDOM TOOTH EXTRACTION      Social History: Social History   Tobacco Use   Smoking status: Never   Smokeless tobacco: Never  Substance Use Topics   Alcohol use: No    Alcohol/week: 0.0 standard drinks    Comment: social   Drug use: No    ROS: Negative for fevers, chills. Positive for headaches. All other systems reviewed and negative unless stated otherwise in HPI.   Physical Exam:   Vital Signs: BP 106/70   Pulse 80   Ht 5\' 8"  (1.727 m)  Wt 195 lb (88.5 kg)   BMI 29.65 kg/m  GENERAL: well appearing,in no acute distress,alert SKIN:  Color, texture, turgor normal. No rashes or lesions HEAD:  Normocephalic/atraumatic. CV:  RRR RESP: Normal respiratory effort MSK: no tenderness to palpation over occiput, neck, or shoulders  NEUROLOGICAL: Mental Status: Alert, oriented to person, place and time,Follows commands Cranial Nerves: PERRL,visual fields intact to confrontation,extraocular movements intact,facial sensation intact,no facial droop or ptosis,hearing intact to finger rub bilaterally,no dysarthria,palate elevate symmetrically,tongue protrudes  midline,shoulder shrug intact and symmetric Motor: muscle strength 5/5 both upper and lower extremities,no drift, normal tone Reflexes: 2+ throughout Sensation: intact to light touch all 4 extremities Coordination: Finger-to- nose-finger intact bilaterally,Heel-to-shin intact bilaterally Gait: normal-based   IMPRESSION: 35 year old female who presents for evaluation of migraines which have increased in frequency over the past 3 months. She responds well to Relpax for rescue, but has been overusing it due to high frequency of migraines. She may have a component of medication overuse headache due to her frequent triptan use. Headaches previously improved on Topamax but she was unable to tolerate it due to side effects. Will start Zonisamide for prevention. Referral to neck PT placed for cervicalgia and neck tension.  PLAN: -Preventive: Start Zonisamide 100 mg daily for two weeks, then increase to 200 mg daily. -Rescue: Continue Relpax 40 mg PRN -Neck PT -next steps: consider propranolol, CGRP, botox   I spent a total of 45 minutes chart reviewing and counseling the patient. Headache education was done. Discussed treatment options including preventive and acute medications, natural supplements, and physical therapy. Discussed medication overuse headache and to limit use of acute treatments to no more than 2 days/week or 10 days/month. Discussed medication side effects, adverse reactions and drug interactions. Written educational materials and patient instructions outlining all of the above were given.  Follow-up: 3 months   Ocie Doyne, MD 06/03/2021   1:23 PM

## 2021-07-30 ENCOUNTER — Ambulatory Visit: Payer: BC Managed Care – PPO | Admitting: Physical Therapy

## 2021-08-06 ENCOUNTER — Ambulatory Visit: Payer: BC Managed Care – PPO | Attending: Psychiatry | Admitting: Physical Therapy

## 2021-08-06 ENCOUNTER — Other Ambulatory Visit: Payer: Self-pay

## 2021-08-06 ENCOUNTER — Encounter: Payer: Self-pay | Admitting: Physical Therapy

## 2021-08-06 DIAGNOSIS — G43119 Migraine with aura, intractable, without status migrainosus: Secondary | ICD-10-CM | POA: Insufficient documentation

## 2021-08-06 DIAGNOSIS — R293 Abnormal posture: Secondary | ICD-10-CM | POA: Diagnosis not present

## 2021-08-06 DIAGNOSIS — M542 Cervicalgia: Secondary | ICD-10-CM | POA: Diagnosis not present

## 2021-08-06 NOTE — Therapy (Signed)
[facility] [address], [address] [place], [place], [address] Phone: [phone]   Fax:  [phone]  Physical Therapy Evaluation  Patient Details  Name: Gabrielle Foster MRN: [id number] Date of Birth: [birth year] Referring Provider (PT): Ocie Doyne   Encounter Date: 08/06/2021   PT End of Session - 08/06/21 1809     Visit Number 1    Number of Visits 6    Date for PT Re-Evaluation 09/20/21    Authorization Type BCBS    PT Start Time 1404    PT Stop Time 1445    PT Time Calculation (min) 41 min    Activity Tolerance Patient tolerated treatment well    Behavior During Therapy [facility] for tasks assessed/performed             Past Medical History:  Diagnosis Date   ADD (attention deficit disorder)    as child   Anxiety    Common migraine with intractable migraine 04/09/2015   Depression    Headache(784.0)    Migraines   Migraines     Past Surgical History:  Procedure Laterality Date   VULVA SURGERY     vaginal septum repair   WISDOM TOOTH EXTRACTION      There were no vitals filed for this visit.    Subjective Assessment - 08/06/21 1408     Subjective Saw neurologist a little while ago-was using a medication acutely and frequently (Elitriptan).  Neurologist wanted me to have PT, as I have neck pain.  Neck pain will bring the headaches on.    Patient Stated Goals To work on neck to lessen headaches.    Currently in Pain? Yes    Pain Score 0-No pain   daily ache in neck area would be 4-5/10; trigger pain in neck before headaches 8/10               [facility] PT Assessment - 08/06/21 1410       Assessment   Medical Diagnosis intractable migraines    Referring Provider (PT) Ocie Doyne    Onset Date/Surgical Date 06/03/21   MD order   Hand Dominance Right    Prior Therapy None      Precautions   Precautions None      Balance Screen   Has the patient fallen in the past 6 months No    Has the  patient had a decrease in activity level because of a fear of falling?  No    Is the patient reluctant to leave their home because of a fear of falling?  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  Enjoys walking and lifting lifting light weights.      Observation/Other Assessments   Focus on Therapeutic Outcomes (FOTO)  NA    Other Surveys  Neck Disability Index    Neck Disability Index  Score = 32%.  Section 4:  2, Section 5:  4, Section 6:  2, Section 8:  2, Section 9:  2      Posture/Postural Control   Posture/Postural Control Postural limitations    Postural Limitations Rounded Shoulders;Forward head      ROM / Strength  AROM / PROM / Strength AROM      AROM   AROM Assessment Site Cervical    Cervical Flexion 46    Cervical Extension 41    Cervical - Right Side Bend 35    Cervical - Left Side Bend 35    Cervical - Right Rotation 40    Cervical - Left Rotation 45      Palpation   Palpation comment tender to palpation R upper traps, R and L rhomboids, cervical paraspinals                        Objective measurements completed on examination: See above findings.            HEP initiated with shoulder rolls, chin retraction, and upper traps stretch.  See instructions.    PT Education - 08/06/21 1807     Education Details Initiated HEP, PT POC and eval results; plan to transfer to [facility] for more ortho based care; initial posture education    Person(s) Educated Patient    Methods Explanation;Demonstration;Handout    Comprehension Verbalized understanding;Returned demonstration              PT Short Term Goals - 08/06/21 1817       PT SHORT TERM GOAL #1   Title Pt will be independent with HEP for  improved flexibility, decreased pain.  TARGET 08/30/2021    Time 4    Period Weeks    Status New               PT Long Term Goals - 08/06/21 1817       PT LONG TERM GOAL #1   Title Pt will be independent with HEP for improved flexibility, decreased pain, improved posture.  TARGET 09/20/2021    Time 7    Period Weeks    Status New      PT LONG TERM GOAL #2   Title Pt will improve cervical rotation by at least 10 degrees R and L for improved cervical flexibility.    Time 7    Period Weeks    Status New      PT LONG TERM GOAL #3   Title Pt will improve NDI score to less than or equal to 20% disability, with decreased pain.    Baseline 32%    Time 7    Period Weeks    Status New                    Plan - 08/06/21 1809     Clinical Impression Statement Pt is a 35 year old female who presents to OPPT with history of migraines, with neck pain and stiffness reported as a trigger.  Migraines have worsened in the past 6 months or so and MD has tried new medication (pt no longer taking) and referred to PT to address neck pain to assist in decreasing migraines.  Pt reprots migraines occur approximately 2x/wk.  She presents with decreased flexibility, abnormal posture, pain, tenderness to palpation along cervical paraspinals and rhomboids.  She reports 32% score on Neck Disability Index and reports pain interferes with work, childcare, and relationships.  She would benefit from skilled PT to address the above stated deficits to decrease pain and improve overall function through daily activities.    Personal Factors and Comorbidities Comorbidity 1    Comorbidities hx of migraines    Examination-Activity Limitations Caring for Others;Sit  Examination-Participation Restrictions Occupation;Community Activity;Driving    Stability/Clinical Decision Making Stable/Uncomplicated    Clinical Decision Making Low    Rehab Potential Good    PT Frequency 1x / week   per pt request, 1x/wk  due to work scheduling   PT Duration 6 weeks   plus eval week = 7 week total POC   PT Treatment/Interventions ADLs/Self Care Home Management;Electrical Stimulation;Cryotherapy;Moist Heat;Traction;Ultrasound;Neuromuscular re-education;Therapeutic exercise;Therapeutic activities;Patient/family education;Manual techniques;Dry needling;Passive range of motion    PT Next Visit Plan Review initial HEP; dry needling for pain, additional stretches, STM, postural strengthening.    Consulted and Agree with Plan of Care Patient             Patient will benefit from skilled therapeutic intervention in order to improve the following deficits and impairments:  Pain, Impaired flexibility, Decreased strength, Postural dysfunction  Visit Diagnosis: Cervicalgia  Abnormal posture     Problem List Patient Active Problem List   Diagnosis Date Noted   SVD (spontaneous vaginal delivery) 12/24/2016   Indication for care in labor or delivery 12/23/2016   Common migraine with intractable migraine 04/09/2015   Vacuum extractor delivery, delivered 08/15/2013   Active labor 08/14/2013   Transverse vaginal septum 07/07/2013   Migraines 07/07/2013    Zoee Heeney W., PT 08/06/2021, 6:20 PM  [facility] [address] [place], [place], [address] Phone: [phone]   Fax:  [phone]  Name: Gabrielle Foster MRN: [id number] Date of Birth: 1986-06-26

## 2021-08-06 NOTE — Patient Instructions (Signed)
Access Code: [email or web address] URL: [email or web address] Date: 08/06/2021 Prepared by: [facility]  Exercises Seated Shoulder Rolls - 1 x daily - 7 x weekly - 1-2 sets - 10 reps Seated Passive Cervical Retraction - 1-2 x daily - 7 x weekly - 1-2 sets - 10 reps Seated Upper Trapezius Stretch - 1-2 x daily - 7 x weekly - 1-2 sets - 10 reps

## 2021-09-09 ENCOUNTER — Ambulatory Visit: Payer: BC Managed Care – PPO | Admitting: Psychiatry

## 2021-10-24 DIAGNOSIS — J069 Acute upper respiratory infection, unspecified: Secondary | ICD-10-CM | POA: Diagnosis not present

## 2021-11-12 DIAGNOSIS — L814 Other melanin hyperpigmentation: Secondary | ICD-10-CM | POA: Diagnosis not present

## 2021-11-12 DIAGNOSIS — L218 Other seborrheic dermatitis: Secondary | ICD-10-CM | POA: Diagnosis not present

## 2021-11-12 DIAGNOSIS — D225 Melanocytic nevi of trunk: Secondary | ICD-10-CM | POA: Diagnosis not present

## 2021-11-12 DIAGNOSIS — L718 Other rosacea: Secondary | ICD-10-CM | POA: Diagnosis not present

## 2021-12-09 ENCOUNTER — Other Ambulatory Visit: Payer: Self-pay

## 2021-12-09 MED ORDER — ELETRIPTAN HYDROBROMIDE 40 MG PO TABS: 40.0000 mg | ORAL_TABLET | ORAL | 3 refills | Status: DC | PRN

## 2021-12-10 ENCOUNTER — Telehealth: Payer: Self-pay

## 2021-12-10 NOTE — Telephone Encounter (Signed)
PA for Eletriptan  ?(Key: [id number]) ? ?Your information has been submitted to [facility]. Blue Cross [place] will review the request and notify you of the determination decision directly, typically within 72 hours of receiving all information. ? ?You will also receive your request decision electronically. To check for an update later, open this request again from your dashboard. ? ?If [employer] [place] has not responded within the specified timeframe or if you have any questions about your PA submission, contact Blue Cross [place] directly at [phone] ?

## 2021-12-11 DIAGNOSIS — N898 Other specified noninflammatory disorders of vagina: Secondary | ICD-10-CM | POA: Diagnosis not present

## 2021-12-12 NOTE — Telephone Encounter (Signed)
Your request has been denied. Will wait for BCBS denial letter for details.  ?

## 2022-01-14 ENCOUNTER — Encounter: Payer: Self-pay | Admitting: Psychiatry

## 2022-01-21 DIAGNOSIS — R350 Frequency of micturition: Secondary | ICD-10-CM | POA: Diagnosis not present

## 2022-01-21 DIAGNOSIS — N898 Other specified noninflammatory disorders of vagina: Secondary | ICD-10-CM | POA: Diagnosis not present

## 2022-02-04 DIAGNOSIS — R102 Pelvic and perineal pain: Secondary | ICD-10-CM | POA: Diagnosis not present

## 2022-02-18 ENCOUNTER — Encounter: Payer: Self-pay | Admitting: Psychiatry

## 2022-02-19 ENCOUNTER — Telehealth: Payer: Self-pay | Admitting: Psychiatry

## 2022-02-19 NOTE — Telephone Encounter (Signed)
I called [facility] spoke with Juanna Cao  she states Botox is not covered under patients Pharmacy policy. She stated it would go through patients Medical plan. Reference number for today's call is Rolland Bimler [id number].

## 2022-02-19 NOTE — Telephone Encounter (Signed)
I called [employer] spoke with Sander Radon, she states patient plan can be Arnoldo Morale and Annette Stable however the specialty pharmacy to [place] would be [facility] [phone]. Call reference number for today is [id number].

## 2022-02-19 NOTE — Telephone Encounter (Signed)
Completed BCBS PA for Botox placed in Nurse Pod for MD signature.

## 2022-02-20 NOTE — Telephone Encounter (Signed)
Faxed signed PA form with OV notes to BCBS. ?

## 2022-02-24 ENCOUNTER — Encounter: Payer: Self-pay | Admitting: Psychiatry

## 2022-02-24 DIAGNOSIS — Z13228 Encounter for screening for other metabolic disorders: Secondary | ICD-10-CM | POA: Diagnosis not present

## 2022-02-24 DIAGNOSIS — Z124 Encounter for screening for malignant neoplasm of cervix: Secondary | ICD-10-CM | POA: Diagnosis not present

## 2022-02-24 DIAGNOSIS — Z13220 Encounter for screening for lipoid disorders: Secondary | ICD-10-CM | POA: Diagnosis not present

## 2022-02-24 DIAGNOSIS — Z0142 Encounter for cervical smear to confirm findings of recent normal smear following initial abnormal smear: Secondary | ICD-10-CM | POA: Diagnosis not present

## 2022-02-24 DIAGNOSIS — Z130 Encounter for screening for diseases of the blood and blood-forming organs and certain disorders involving the immune mechanism: Secondary | ICD-10-CM | POA: Diagnosis not present

## 2022-02-24 DIAGNOSIS — Z1151 Encounter for screening for human papillomavirus (HPV): Secondary | ICD-10-CM | POA: Diagnosis not present

## 2022-02-24 DIAGNOSIS — Z1389 Encounter for screening for other disorder: Secondary | ICD-10-CM | POA: Diagnosis not present

## 2022-02-24 DIAGNOSIS — Z1272 Encounter for screening for malignant neoplasm of vagina: Secondary | ICD-10-CM | POA: Diagnosis not present

## 2022-02-24 DIAGNOSIS — Z01419 Encounter for gynecological examination (general) (routine) without abnormal findings: Secondary | ICD-10-CM | POA: Diagnosis not present

## 2022-03-05 ENCOUNTER — Other Ambulatory Visit: Payer: Self-pay | Admitting: Psychiatry

## 2022-03-05 ENCOUNTER — Telehealth: Payer: Self-pay | Admitting: *Deleted

## 2022-03-05 MED ORDER — EMGALITY 120 MG/ML ~~LOC~~ SOAJ: 1.0000 | SUBCUTANEOUS | 6 refills | Status: DC

## 2022-03-05 MED ORDER — EMGALITY 120 MG/ML ~~LOC~~ SOAJ: 2.0000 | Freq: Once | SUBCUTANEOUS | 0 refills | Status: AC

## 2022-03-05 NOTE — Telephone Encounter (Signed)
Emgality PA, Key: [id number]. Your information has been submitted to [facility]. Blue Cross [place] will review the request and notify you of the determination decision directly, typically within 72 hours of receiving all information. If [employer] [place] has not responded within the specified timeframe or if you have any questions about your PA submission, contact Blue Cross  directly at [phone].

## 2022-03-06 ENCOUNTER — Encounter: Payer: Self-pay | Admitting: *Deleted

## 2022-03-10 NOTE — Telephone Encounter (Signed)
Emgality approved, Effective from 03/05/2022 through 05/27/2022. Starter kit included with authorization. Approval faxed to pharmacy.

## 2022-03-25 DIAGNOSIS — F909 Attention-deficit hyperactivity disorder, unspecified type: Secondary | ICD-10-CM | POA: Diagnosis not present

## 2022-03-25 DIAGNOSIS — F411 Generalized anxiety disorder: Secondary | ICD-10-CM | POA: Diagnosis not present

## 2022-03-28 ENCOUNTER — Encounter: Payer: Self-pay | Admitting: Psychiatry

## 2022-04-30 DIAGNOSIS — D225 Melanocytic nevi of trunk: Secondary | ICD-10-CM | POA: Diagnosis not present

## 2022-04-30 DIAGNOSIS — Z872 Personal history of diseases of the skin and subcutaneous tissue: Secondary | ICD-10-CM | POA: Diagnosis not present

## 2022-04-30 DIAGNOSIS — L821 Other seborrheic keratosis: Secondary | ICD-10-CM | POA: Diagnosis not present

## 2022-04-30 DIAGNOSIS — L814 Other melanin hyperpigmentation: Secondary | ICD-10-CM | POA: Diagnosis not present

## 2022-06-05 ENCOUNTER — Telehealth: Payer: Self-pay | Admitting: *Deleted

## 2022-06-05 NOTE — Telephone Encounter (Signed)
Emgality PA, Key: [place], [id number]. Your information has been submitted to [place]. Blue Cross [place] will review the request and notify you of the determination decision directly, typically within 72 hours of receiving all information. If [employer] [place] has not responded within the specified timeframe or if you have any questions about your PA submission, contact [employer] [place] directly at [phone].

## 2022-06-09 NOTE — Telephone Encounter (Signed)
Emgality approved  06/05/2022 through 06/04/2023. Approval faxed to pharmacy.

## 2022-06-10 ENCOUNTER — Other Ambulatory Visit: Payer: Self-pay | Admitting: *Deleted

## 2022-06-10 MED ORDER — ELETRIPTAN HYDROBROMIDE 40 MG PO TABS: 40.0000 mg | ORAL_TABLET | ORAL | 3 refills | Status: DC | PRN

## 2022-06-11 DIAGNOSIS — R059 Cough, unspecified: Secondary | ICD-10-CM | POA: Diagnosis not present

## 2022-06-11 DIAGNOSIS — J329 Chronic sinusitis, unspecified: Secondary | ICD-10-CM | POA: Diagnosis not present

## 2022-06-11 DIAGNOSIS — R0981 Nasal congestion: Secondary | ICD-10-CM | POA: Diagnosis not present

## 2022-06-11 DIAGNOSIS — J40 Bronchitis, not specified as acute or chronic: Secondary | ICD-10-CM | POA: Diagnosis not present

## 2022-09-04 ENCOUNTER — Telehealth: Payer: Self-pay | Admitting: *Deleted

## 2022-09-04 ENCOUNTER — Encounter: Payer: Self-pay | Admitting: Psychiatry

## 2022-09-04 NOTE — Telephone Encounter (Signed)
Pt states pharmacy informed her PA needed for Em[employer]. Can you help with this? Thank you

## 2022-09-09 NOTE — Telephone Encounter (Signed)
Pt called wanting to know how long this process takes because she is due for her injection tomorrow.

## 2022-09-09 NOTE — Telephone Encounter (Signed)
I was unable to start PA on covermymeds. Called [facility] at [phone] that was on pt insurance card. Spoke w/ Bethel Born. States I have to go to [email or web address] to print PA form to complete and fax in. I located and printed. In process of completing.

## 2022-09-09 NOTE — Telephone Encounter (Signed)
Can you call pt and ask if below insurance is her current insurance? If not, ask her to upload front/back of card to [facility] please so we can work on this for her. I do not have update from prior auth team yet. Waiting on a response.

## 2022-09-10 NOTE — Telephone Encounter (Signed)
Faxed signed PA back to [facility] at [phone]. Marked urgent. Waiting on determination.

## 2022-09-10 NOTE — Telephone Encounter (Signed)
[facility] asked for chart notes. I faxed back and received fax confirmation. Waiting on determination.

## 2022-09-15 ENCOUNTER — Other Ambulatory Visit (HOSPITAL_COMMUNITY): Payer: Self-pay

## 2022-09-22 ENCOUNTER — Telehealth: Payer: Self-pay | Admitting: Psychiatry

## 2022-09-22 NOTE — Telephone Encounter (Signed)
LVM and sent mychart msg informing pt of need to reschedule 10/08/22 appointment - MD out

## 2022-10-08 ENCOUNTER — Ambulatory Visit: Payer: BC Managed Care – PPO | Admitting: Psychiatry

## 2022-11-10 ENCOUNTER — Other Ambulatory Visit: Payer: Self-pay

## 2022-11-10 MED ORDER — EMGALITY 120 MG/ML ~~LOC~~ SOAJ: 1.0000 | SUBCUTANEOUS | 4 refills | Status: DC

## 2023-02-24 ENCOUNTER — Encounter: Payer: Self-pay | Admitting: Adult Health

## 2023-02-24 MED ORDER — EMGALITY 120 MG/ML ~~LOC~~ SOAJ: 1.0000 | SUBCUTANEOUS | 0 refills | Status: DC

## 2023-03-22 NOTE — Progress Notes (Unsigned)
PATIENT: Gabrielle Foster DOB: December 05, 1985  REASON FOR VISIT: follow up HISTORY FROM: patient PRIMARY NEUROLOGIST: Dr. Delena Bali  HISTORY OF PRESENT ILLNESS: Today 03/22/23  Gabrielle Foster is a 37 y.o. female who has been followed in this office for Migraines. Returns today for follow-up.   HISTORY The patient presents for evaluation of migraines which have been present since elementary school. She previously followed with Dr. Anne Hahn in 2016 who prescribed Topamax for prevention and Relpax for rescue. She was unable to tolerate Topamax due to side effects.   Currently she takes Relpax as needed for migraines which works well for her. She has noticed that migraines worsen right before her period and in the first week of her period.  She used to be able to go 1-2 months without a migraine. However in the past 3 months she has been using all 12 of her Relpax. She has been taking Relpax ~3 times per week. She does have a few lower level headache days per month as well as some pain-free days.   Headache History: Onset: childhood Triggers: alcohol, smells, dehydration, fasting, menstruation Aura: rarely visual aura Location: left temporal, occipital Quality/Description: throbbing Associated Symptoms:             Photophobia: yes             Phonophobia: yes             Nausea: yes Worse with activity?: yes Duration of headaches: 30 minutes with Relpax   Headache days per month: 15 Headache free days per month: 15   Current Treatment: Abortive Relpax 40 mg PRN   Preventative none   Prior Therapies                                 Topamax 75 mg QHS- paresthesias, fatigue, brain fog Trokendi Lexapro Amitriptyline 10 mg - weight gain Relpax 40 mg magnesium   Headache Risk Factors: Headache risk factors and/or co-morbidities (+) Neck Pain (-) History of Motor Vehicle Accident (+) Obesity  Body mass index is 29.65 kg/m. (-) History of Traumatic Brain Injury  and/or Concussion    REVIEW OF SYSTEMS: Out of a complete 14 system review of symptoms, the patient complains only of the following symptoms, and all other reviewed systems are negative.  ALLERGIES: No Known Allergies  HOME MEDICATIONS: Outpatient Medications Prior to Visit  Medication Sig Dispense Refill   eletriptan (RELPAX) 40 MG tablet Take 1 tablet (40 mg total) by mouth every 2 (two) hours as needed for migraine or headache. 10 tablet 3   Galcanezumab-gnlm (EMGALITY) 120 MG/ML SOAJ Inject 1 Pen into the skin every 30 (thirty) days. 1.12 mL 0   No facility-administered medications prior to visit.    PAST MEDICAL HISTORY: Past Medical History:  Diagnosis Date   ADD (attention deficit disorder)    as child   Anxiety    Common migraine with intractable migraine 04/09/2015   Depression    Headache(784.0)    Migraines   Migraines     PAST SURGICAL HISTORY: Past Surgical History:  Procedure Laterality Date   VULVA SURGERY     vaginal septum repair   WISDOM TOOTH EXTRACTION      FAMILY HISTORY: Family History  Problem Relation Age of Onset   Kidney disease Mother    Hyperlipidemia Mother    Heart disease Mother    Hypertension Father    Migraines  Father    Migraines Sister     SOCIAL HISTORY: Social History   Socioeconomic History   Marital status: Married    Spouse name: Jared   Number of children: 2   Years of education: master's   Highest education level: Not on file  Occupational History   Occupation: Freight forwarder    Comment: LCSW  Tobacco Use   Smoking status: Never   Smokeless tobacco: Never  Substance and Sexual Activity   Alcohol use: No    Alcohol/week: 0.0 standard drinks of alcohol    Comment: social   Drug use: No   Sexual activity: Yes  Other Topics Concern   Not on file  Social History Narrative   Patient drinks 1-2 cups of caffeine daily.   Patient is right handed.   Social Determinants of Health   Financial  Resource Strain: Low Risk  (10/31/2022)   Received from [facility], [facility]   Overall Financial Resource Strain (CARDIA)    Difficulty of Paying Living Expenses: Not hard at all  Food Insecurity: No Food Insecurity (10/31/2022)   Received from [facility], [facility]   Hunger Vital Sign    Worried About Running Out of Food in the Last Year: Never true    Ran Out of Food in the Last Year: Never true  Transportation Needs: No Transportation Needs (10/31/2022)   Received from [facility], [facility]   PRAPARE - Transportation    Lack of Transportation (Medical): No    Lack of Transportation (Non-Medical): No  Physical Activity: Not on file  Stress: Not on file  Social Connections: Unknown (12/09/2021)   Received from [facility], [facility]   Social Network    Social Network: Not on file  Intimate Partner Violence: Unknown (11/15/2021)   Received from [facility], [facility]   HITS    Physically Hurt: Not on file    Insult or Talk Down To: Not on file    Threaten Physical Harm: Not on file    Scream or Curse: Not on file      PHYSICAL EXAM  There were no vitals filed for this visit. There is no height or weight on file to calculate BMI.  Generalized: Well developed, in no acute distress   Neurological examination  Mentation: Alert oriented to time, place, history taking. Follows all commands speech and language fluent Cranial nerve II-XII: Pupils were equal round reactive to light. Extraocular movements were full, visual field were full on confrontational test. Facial sensation and strength were normal. Uvula tongue midline. Head turning and shoulder shrug  were normal and symmetric. Motor: The motor testing reveals 5 over 5 strength of all 4 extremities. Good symmetric motor tone is noted throughout.  Sensory: Sensory testing is intact to soft touch on all 4 extremities. No evidence of extinction is noted.  Coordination: Cerebellar testing reveals good  finger-nose-finger and heel-to-shin bilaterally.  Gait and station: Gait is normal. Tandem gait is normal. Romberg is negative. No drift is seen.  Reflexes: Deep tendon reflexes are symmetric and normal bilaterally.   DIAGNOSTIC DATA (LABS, IMAGING, TESTING) - I reviewed patient records, labs, notes, testing and imaging myself where available.  Lab Results  Component Value Date   WBC 17.0 (H) 12/24/2016   HGB 9.7 (L) 12/24/2016   HCT 31.0 (L) 12/24/2016   MCV 82.0 12/24/2016   PLT 134 (L) 12/24/2016      Component Value Date/Time   NA 136 02/20/2016 2056  K 3.6 02/20/2016 2056   CL 108 02/20/2016 2056   CO2 21 (L) 02/20/2016 2056   GLUCOSE 84 02/20/2016 2056   BUN 8 02/20/2016 2056   CREATININE 0.69 02/20/2016 2056   CALCIUM 8.9 02/20/2016 2056   PROT 7.6 02/20/2016 2056   ALBUMIN 4.0 02/20/2016 2056   AST 17 02/20/2016 2056   ALT 12 (L) 02/20/2016 2056   ALKPHOS 37 (L) 02/20/2016 2056   BILITOT 0.7 02/20/2016 2056   GFRNONAA >60 02/20/2016 2056   GFRAA >60 02/20/2016 2056       ASSESSMENT AND PLAN 37 y.o. year old female  has a past medical history of ADD (attention deficit disorder), Anxiety, Common migraine with intractable migraine (04/09/2015), Depression, Headache(784.0), and Migraines. here with ***     Butch Penny, MSN, NP-C 03/22/2023, 3:27 PM [facility] [address], [address] [place], [place] [address] [phone]

## 2023-03-23 ENCOUNTER — Encounter: Payer: Self-pay | Admitting: Adult Health

## 2023-03-23 ENCOUNTER — Ambulatory Visit (INDEPENDENT_AMBULATORY_CARE_PROVIDER_SITE_OTHER): Payer: PRIVATE HEALTH INSURANCE | Admitting: Adult Health

## 2023-03-23 VITALS — BP 112/76 | HR 71 | Ht 68.0 in | Wt 183.0 lb

## 2023-03-23 DIAGNOSIS — G43009 Migraine without aura, not intractable, without status migrainosus: Secondary | ICD-10-CM

## 2023-03-23 MED ORDER — ELETRIPTAN HYDROBROMIDE 40 MG PO TABS: 40.0000 mg | ORAL_TABLET | ORAL | 11 refills | Status: DC | PRN

## 2023-03-23 MED ORDER — EMGALITY 120 MG/ML ~~LOC~~ SOAJ: 1.0000 | SUBCUTANEOUS | 11 refills | Status: DC

## 2023-03-23 NOTE — Patient Instructions (Signed)
Your Plan:  Continue Emgality  Continue Relpax If your symptoms worsen or you develop new symptoms please let us know.   Thank you for coming to see [place] at [facility]. I hope we have been able to provide you high quality care today.  You may receive a patient satisfaction survey over the next few weeks. We would appreciate your feedback and comments so that we may continue to improve ourselves and the health of our patients.

## 2023-04-23 ENCOUNTER — Ambulatory Visit: Payer: BC Managed Care – PPO | Admitting: Adult Health

## 2023-06-04 ENCOUNTER — Other Ambulatory Visit (HOSPITAL_COMMUNITY): Payer: Self-pay

## 2023-09-02 ENCOUNTER — Encounter: Payer: Self-pay | Admitting: Adult Health

## 2023-09-02 ENCOUNTER — Telehealth: Payer: Self-pay | Admitting: *Deleted

## 2023-09-02 NOTE — Telephone Encounter (Signed)
Patient said pharmacy sent [place] PA for Emgality. States she was  due on 1/19. Can we do this one asap?

## 2023-09-04 ENCOUNTER — Other Ambulatory Visit (HOSPITAL_COMMUNITY): Payer: Self-pay

## 2023-09-07 ENCOUNTER — Telehealth: Payer: Self-pay

## 2023-09-07 ENCOUNTER — Other Ambulatory Visit (HOSPITAL_COMMUNITY): Payer: Self-pay

## 2023-09-07 NOTE — Telephone Encounter (Signed)
Pharmacy Patient Advocate Encounter   Received notification from Physician's Office that prior authorization for Emgality is required/requested.   Insurance verification completed.   The patient is insured through  [place]  .   Per test claim: PA required; PA submitted to above mentioned insurance via Fax Key/confirmation #/EOC N/A Status is pending  Could not submit PA via CMM-submitted via Faxed Form. Sent to [phone] Awaiting determination

## 2023-09-07 NOTE — Telephone Encounter (Signed)
PA request has been Submitted. New Encounter created for follow up. For additional info see Pharmacy Prior Auth telephone encounter from 09/07/2023.

## 2023-09-10 ENCOUNTER — Other Ambulatory Visit (HOSPITAL_COMMUNITY): Payer: Self-pay

## 2023-09-10 NOTE — Telephone Encounter (Signed)
Pharmacy Patient Advocate Encounter  Received notification from  [employer]  that Prior Authorization for Emgality 120 mg/ml Penhas been APPROVED from 09/09/2023 to 09/08/2024

## 2023-09-10 NOTE — Telephone Encounter (Signed)
Thanks!  I notified the patient.

## 2024-02-18 ENCOUNTER — Telehealth: Payer: Self-pay | Admitting: *Deleted

## 2024-02-18 NOTE — Telephone Encounter (Signed)
 Pa needed for relpax  (eletriptan ).

## 2024-02-19 ENCOUNTER — Telehealth: Payer: Self-pay

## 2024-02-19 ENCOUNTER — Other Ambulatory Visit (HOSPITAL_COMMUNITY): Payer: Self-pay

## 2024-02-19 NOTE — Telephone Encounter (Signed)
 Pharmacy Patient Advocate Encounter   Received notification from Physician's Office that prior authorization for Eletriptan  40MG  Tablet is required/requested.   Insurance verification completed.   The patient is insured through [employer] .   Per test claim: Refill too soon. PA is not needed at this time. Medication was filled 02/17/2024. Next eligible fill date is 02/28/2024.

## 2024-03-14 ENCOUNTER — Telehealth: Payer: Self-pay | Admitting: Adult Health

## 2024-03-14 NOTE — Telephone Encounter (Signed)
 LVM and sent mychart msg informing pt of need to reschedule 03/22/24 appt - NP out

## 2024-03-16 ENCOUNTER — Encounter: Payer: Self-pay | Admitting: Adult Health

## 2024-03-16 ENCOUNTER — Other Ambulatory Visit: Payer: Self-pay | Admitting: Adult Health

## 2024-03-22 ENCOUNTER — Telehealth: Payer: PRIVATE HEALTH INSURANCE | Admitting: Adult Health

## 2024-03-26 ENCOUNTER — Other Ambulatory Visit: Payer: Self-pay | Admitting: Adult Health
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
## 2024-05-17 ENCOUNTER — Telehealth: Payer: PRIVATE HEALTH INSURANCE | Admitting: Adult Health

## 2024-05-17 DIAGNOSIS — G43009 Migraine without aura, not intractable, without status migrainosus: Secondary | ICD-10-CM | POA: Diagnosis not present

## 2024-06-30 ENCOUNTER — Other Ambulatory Visit: Payer: Self-pay | Admitting: Adult Health

## 2024-08-22 ENCOUNTER — Telehealth: Payer: Self-pay

## 2024-08-22 ENCOUNTER — Other Ambulatory Visit (HOSPITAL_COMMUNITY): Payer: Self-pay

## 2024-08-22 NOTE — Telephone Encounter (Signed)
 Pharmacy Patient Advocate Encounter   Received notification from Fax that prior authorization for Emgality  is required/requested.   Insurance verification completed.   The patient is insured through [employer].   Per test claim: PA required; PA submitted to above mentioned insurance via CoverMyMeds Key/confirmation #/EOC [id number] Status is pending

## 2024-09-09 ENCOUNTER — Other Ambulatory Visit: Payer: Self-pay | Admitting: Adult Health

## 2025-05-18 ENCOUNTER — Telehealth: Payer: PRIVATE HEALTH INSURANCE | Admitting: Adult Health
